# Patient Record
Sex: Male | Born: 1955 | Hispanic: No | Marital: Married
Health system: Northeastern US, Academic
[De-identification: ages and names within clinical notes are randomized; demographics above are authoritative.]

---

## 2020-04-23 IMAGING — CT CT PELVIS WITHOUT CONTRAST
3 of 5 series · 16 of 46 positions shown, 18 images · non-contrast
Comparison: There are no previous exams available for comparison.

CT PELVIS WITHOUT CONTRAST, 04/23/2020 [DATE]: 
CLINICAL INDICATION:  Epididymal cyst. Evaluate for hernia. 
A search for DICOM formatted images was conducted for prior CT imaging studies 
completed at a non-affiliated media free facility.
TECHNIQUE: The pelvis was scanned from lower abdomen though the pubic rami 
without contrast on a high-resolution CT scanner using dose reduction 
techniques.  Routine MPR reconstructions were performed.

[Series 3: st hip · axial · 0.98mm/px · z∈[-322,-7]mm · 9 of 264 slices shown, 11 images]
[im 27/264  soft-tissue]
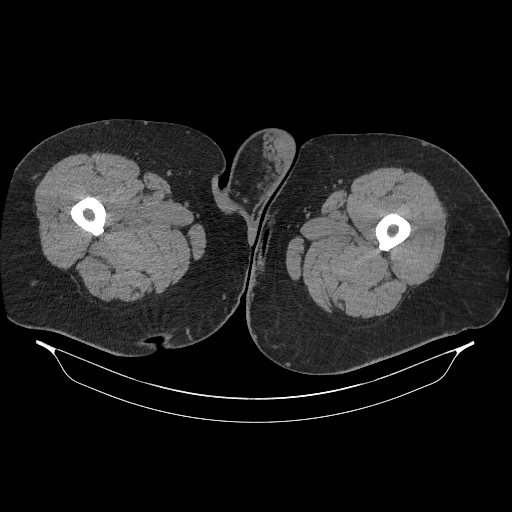
[im 27/264  bone]
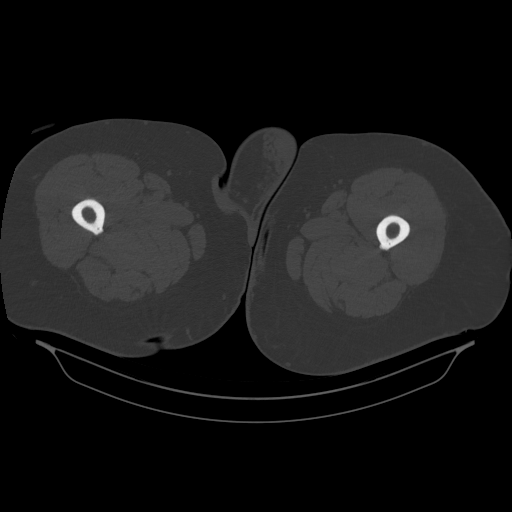
[im 53/264  soft-tissue]
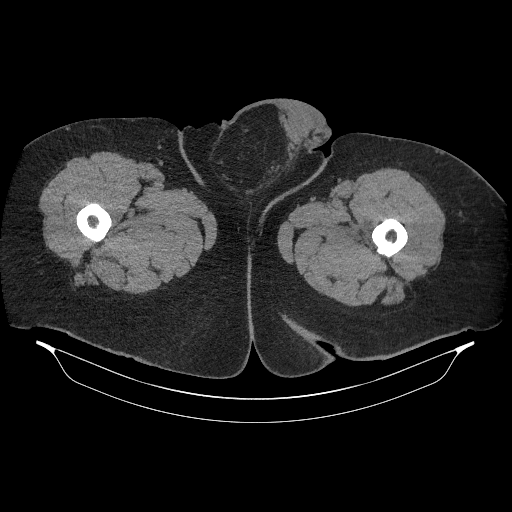
[im 79/264  soft-tissue]
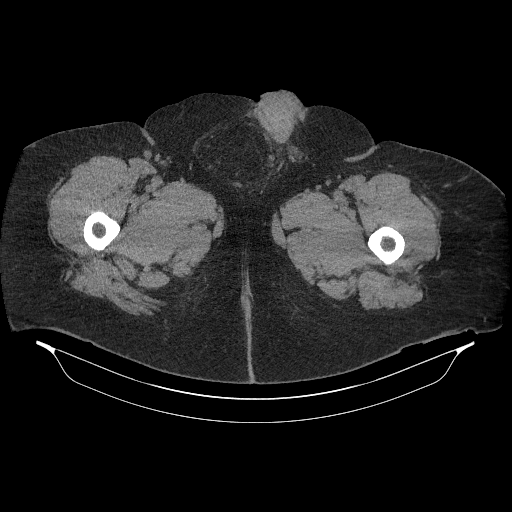
[im 106/264  soft-tissue]
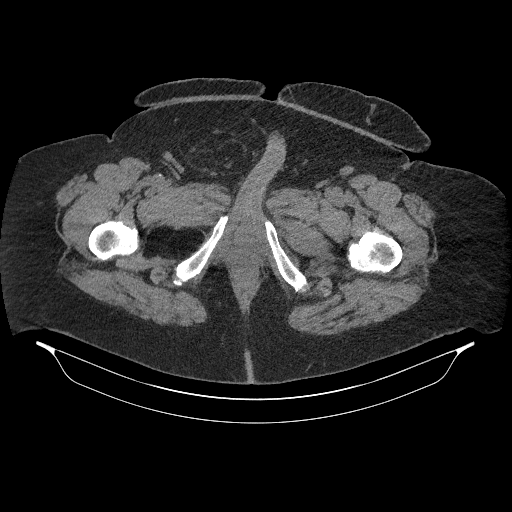
[im 132/264  soft-tissue]
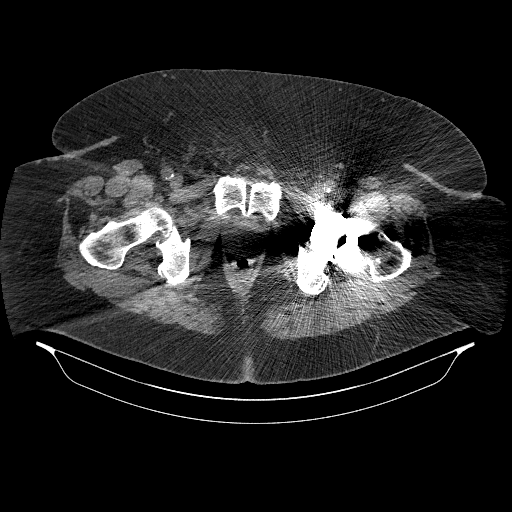
[im 158/264  soft-tissue]
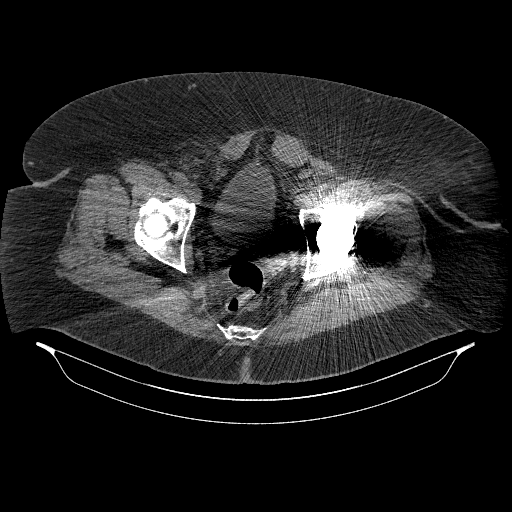
[im 185/264  soft-tissue]
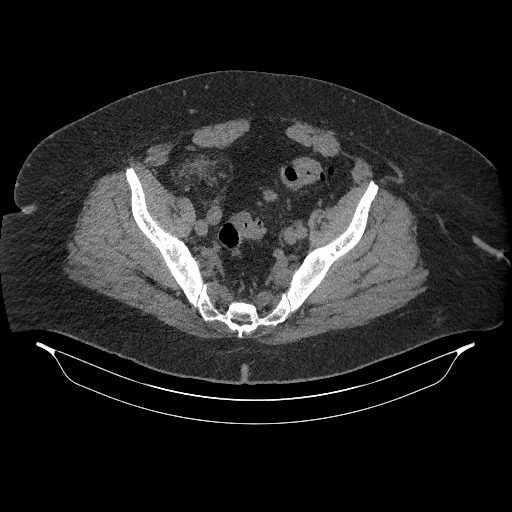
[im 211/264  soft-tissue]
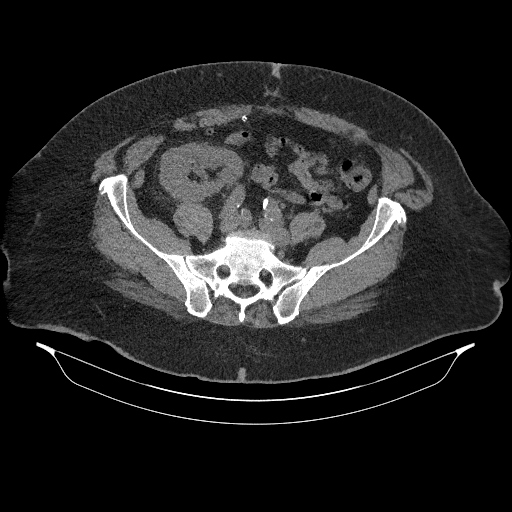
[im 237/264  soft-tissue]
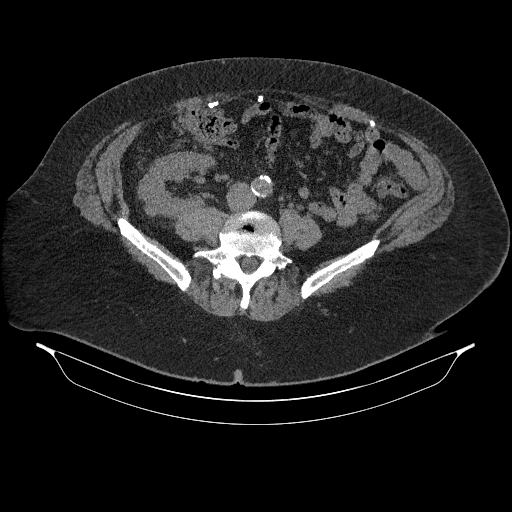
[im 237/264  bone]
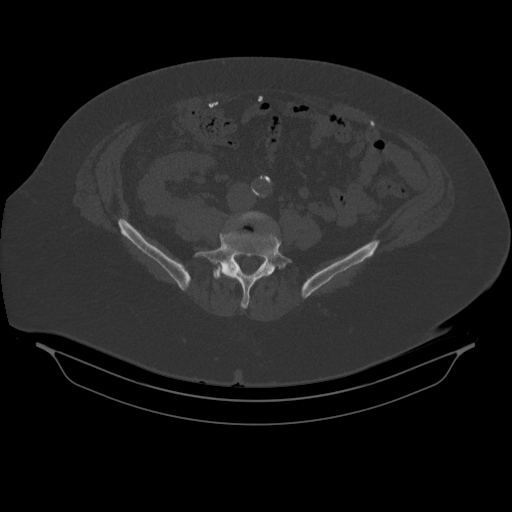

[Series 8: st hip (person_name) · axial · 0.98mm/px · z∈[-322,-204]mm · 4 of 264 slices shown]
[im 27/264  soft-tissue]
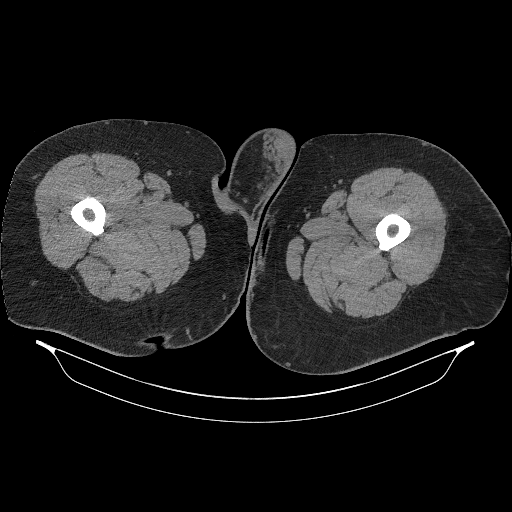
[im 53/264  soft-tissue]
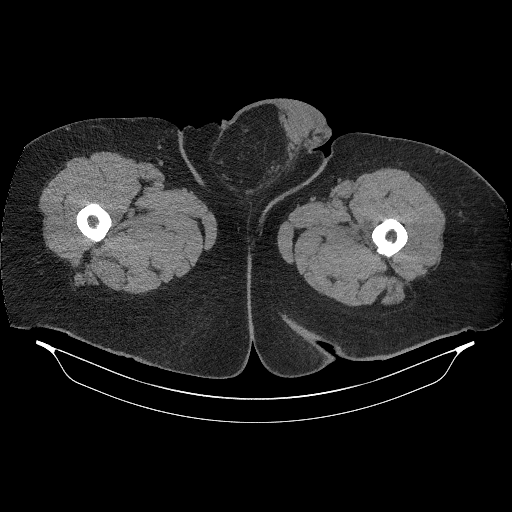
[im 79/264  soft-tissue]
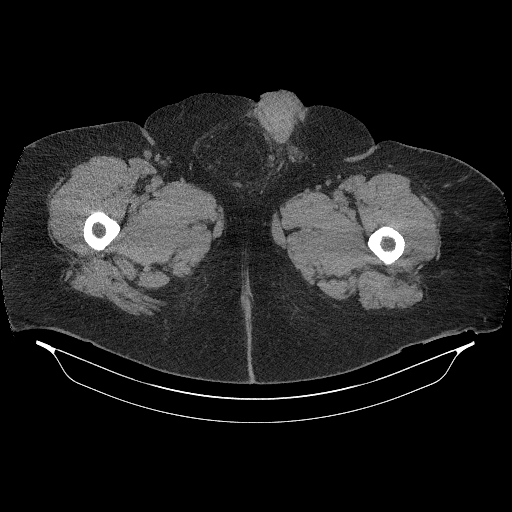
[im 106/264  soft-tissue]
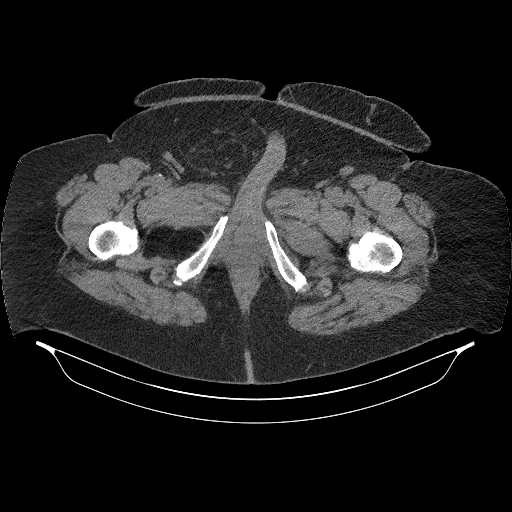

[Series 9: coronal · coronal · 0.82mm/px · 3 of 174 slices shown]
[im 58/174  soft-tissue]
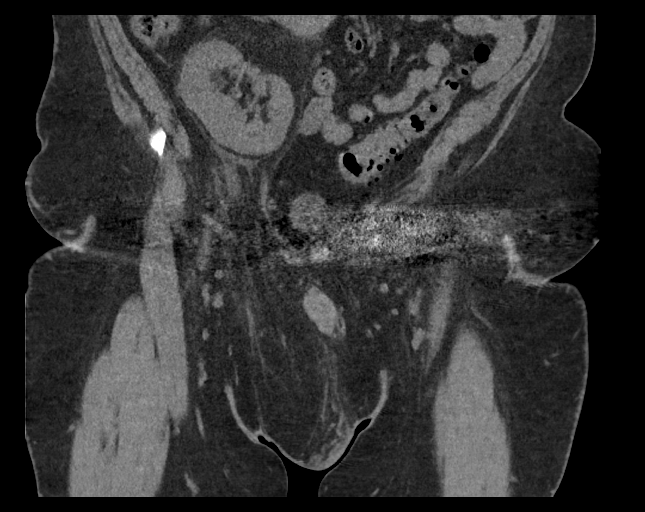
[im 77/174  soft-tissue]
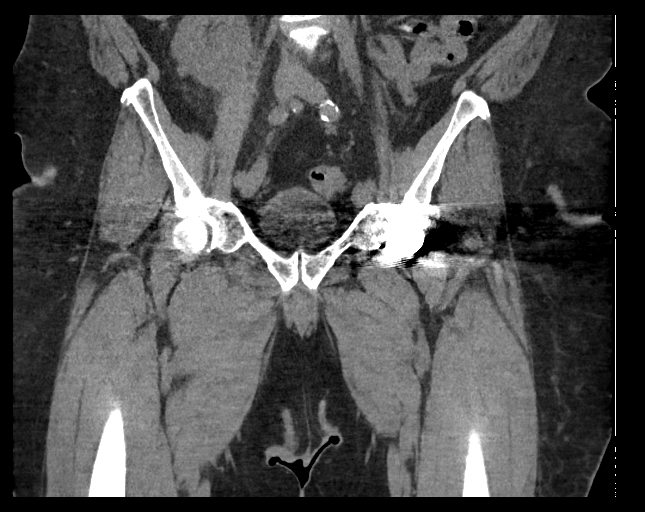
[im 97/174  soft-tissue]
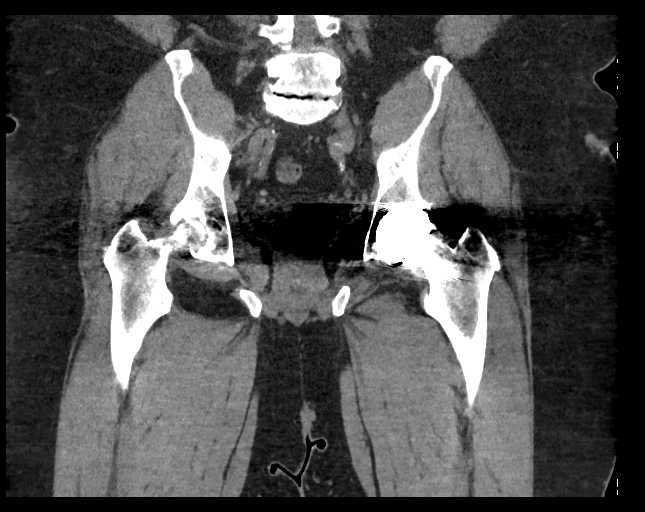

[16 of 46 positions shown; findings below may reference images not displayed]

FINDINGS: Large RIGHT inguinal hernia, 20 cm craniocaudad, 6.3 cm AP and 7.9 cm 
transverse diameter. Bilateral varicoceles. No hydrocele noted. No LEFT inguinal 
hernia. 
Right kidney is low in the RIGHT pelvis, adjacent to 
the RIGHT inguinal hernia. No hydronephrosis. Left kidney is not seen. Bladder 
is empty. Mesh over the lower anterior pelvis. Obesity. Left hip prosthesis. 
Sigmoid diverticulosis. Normal small bowel. Normal appendix. The appendix 
extends lateral to the ptotic RIGHT kidney. Calcified abdominal aorta is not 
dilated. 
Degenerative disc disease L3-4, L4-5 and L5-S1. Moderate spinal stenosis L3-4.
IMPRESSION: Large RIGHT inguinal hernia, containing fat. Ptotic RIGHT kidney adjacent to the 
upper RIGHT inguinal hernia. Bilateral varicoceles. 
RADIATION DOSE REDUCTION: All CT scans are performed using radiation dose 
reduction techniques, when applicable.  Technical factors are evaluated and 
adjusted to ensure appropriate moderation of exposure.  Automated dose 
management technology is applied to adjust the radiation doses to minimize 
exposure while achieving diagnostic quality images.

## 2021-04-14 IMAGING — MR MRI BRAIN WITHOUT CONTRAST
6 of 10 series · 19 of 48 positions shown · non-contrast
Comparison: None

MRI BRAIN WITHOUT CONTRAST, 04/14/2021 [DATE]: 
CLINICAL INDICATION: Dizziness, altered sensorium
TECHNIQUE: Axial T1, Axial T2, Axial FLAIR, Diffusion weighted images, Sagittal 
T1, and Coronal T2 MR images of the brain were performed without intravenous 
contrast enhancement.

[Series 101: survey · axial · 10.0mm · 0.94mm/px · 1 of 5 slices shown]
[im 1/5]
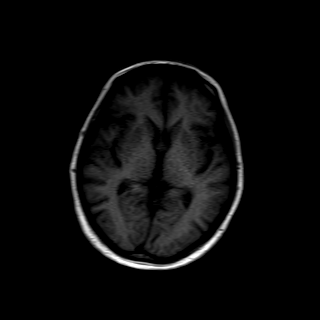

[Series 201: t1_se_sag · sagittal · 4.0mm · 0.49mm/px · 2 of 28 slices shown]
[im 1/28]
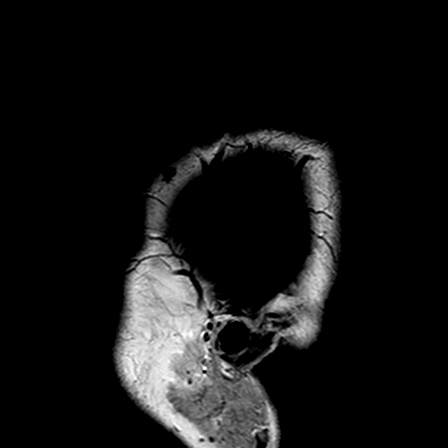
[im 28/28]
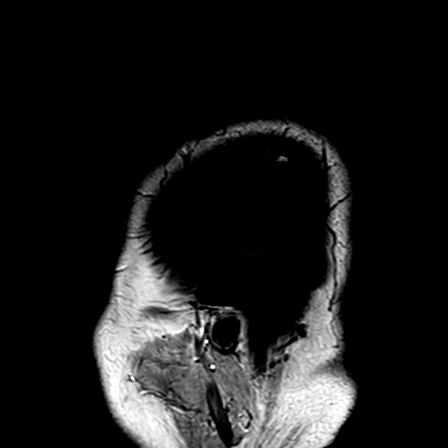

[Series 301: flair_ax · axial · 5.0mm · 0.49mm/px · z∈[-81,+74]mm · 2 of 27 slices shown]
[im 1/27]
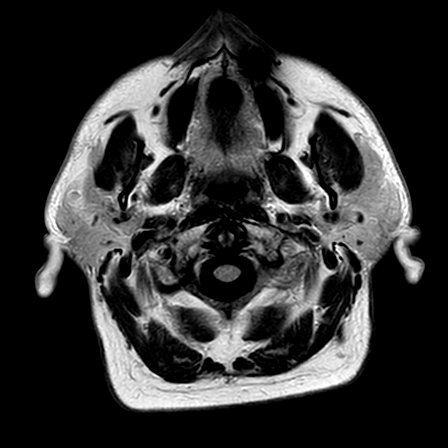
[im 27/27]
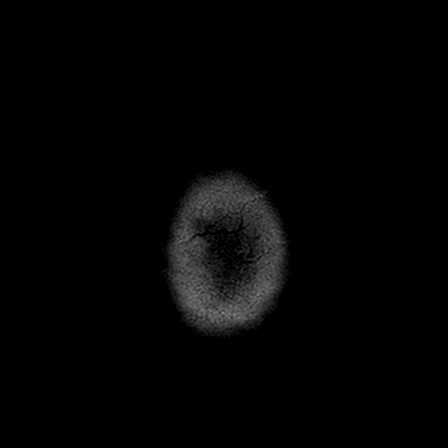

[Series 401: t1w_se_ax · axial · 5.0mm · 0.43mm/px · z∈[-81,+74]mm · 2 of 27 slices shown]
[im 1/27]
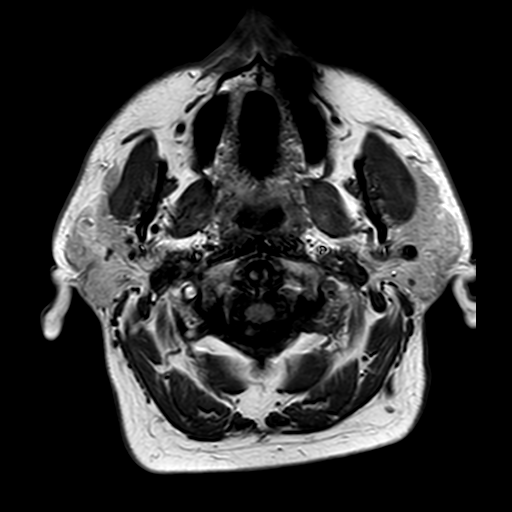
[im 27/27]
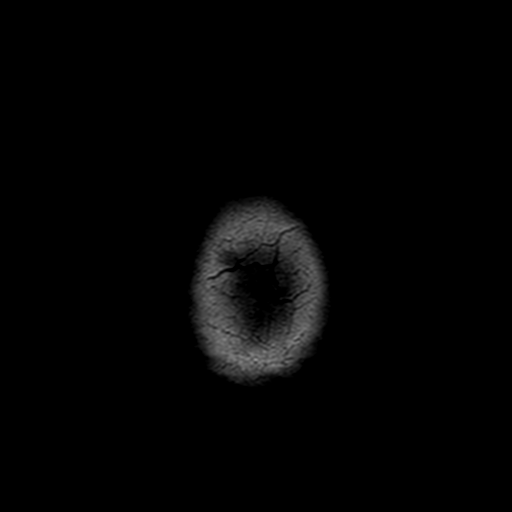

[Series 601: dwi_ax · axial · 5.0mm · 1.03mm/px · 1 of 54 slices shown]
[im 1/54]
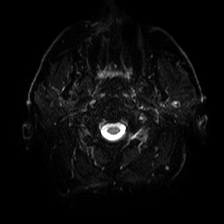

[Series 701: SWI · axial · 3.0mm · 0.40mm/px · z∈[-69,+61]mm · 11 of 200 slices shown]
[im 13/200]
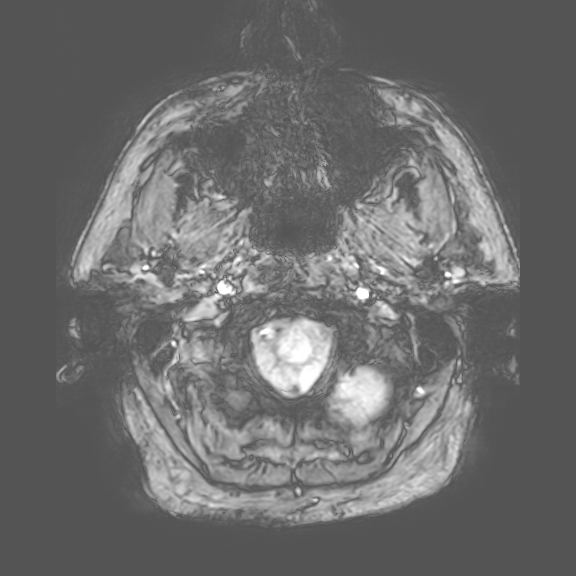
[im 25/200]
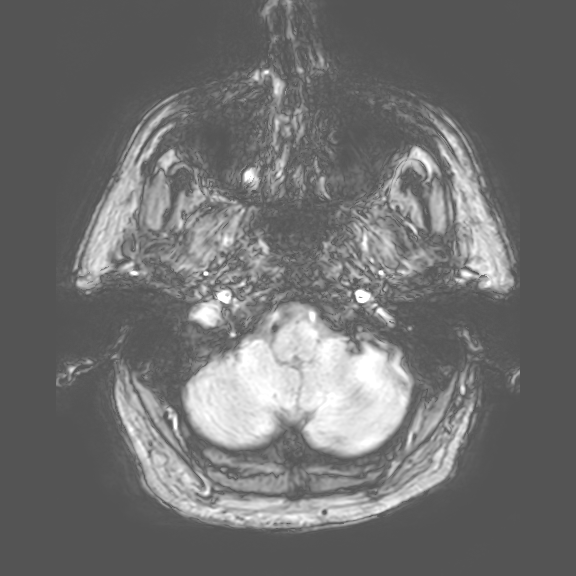
[im 38/200]
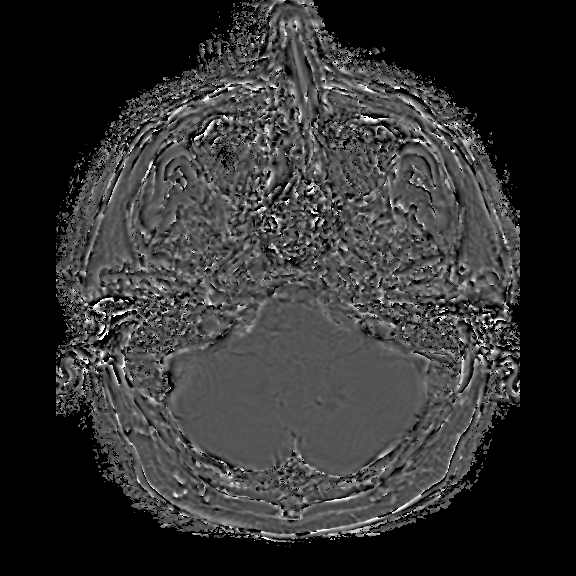
[im 63/200]
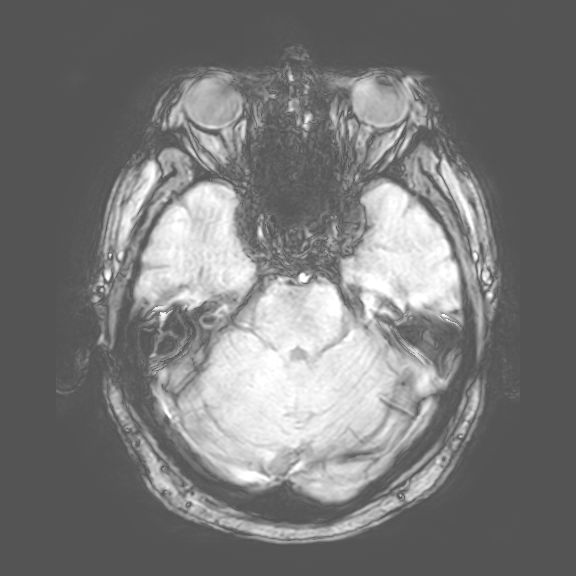
[im 88/200]
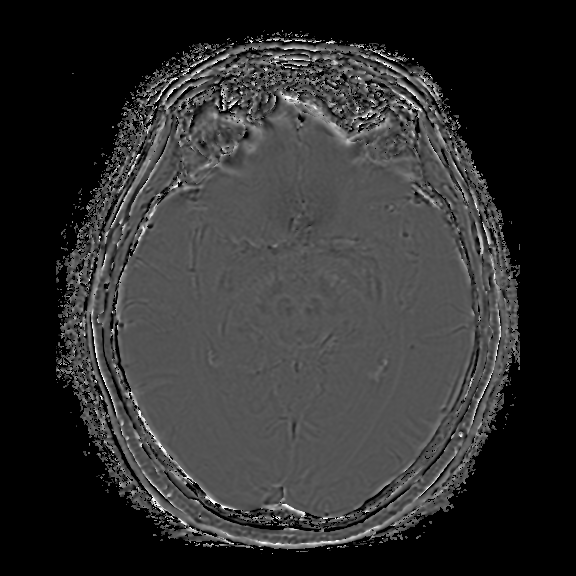
[im 100/200]
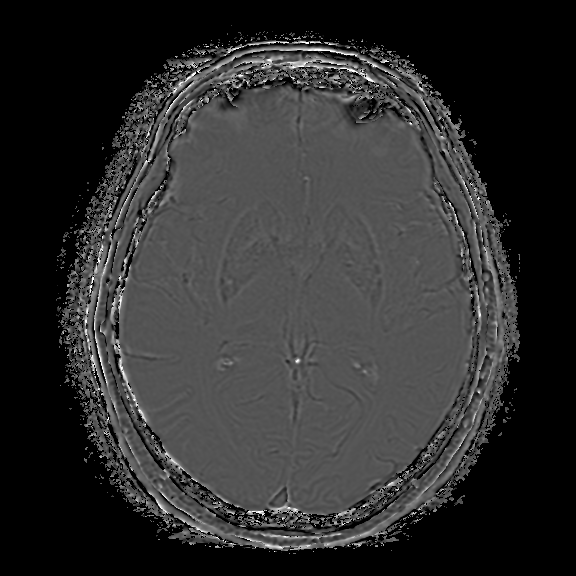
[im 112/200]
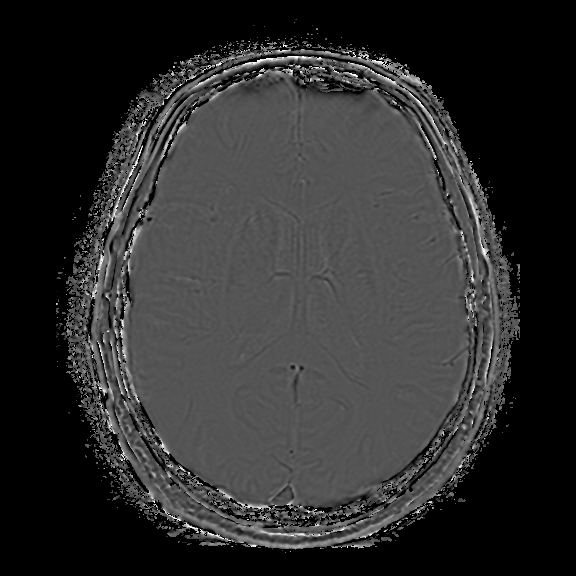
[im 137/200]
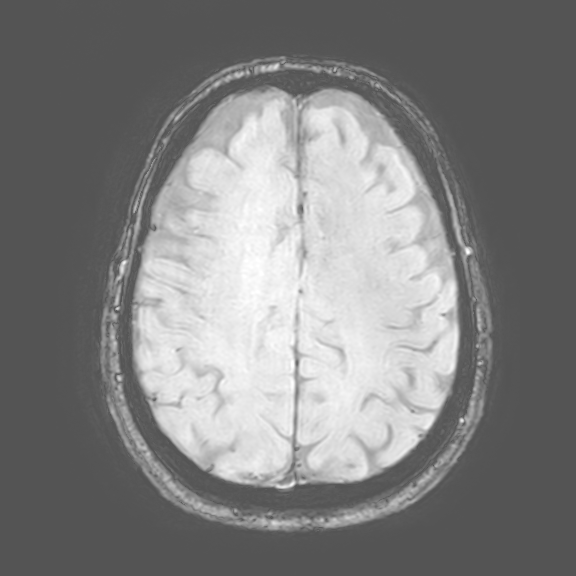
[im 162/200]
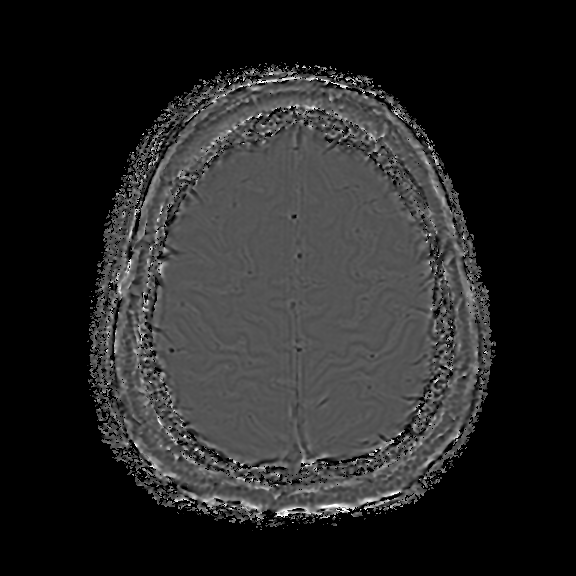
[im 175/200]
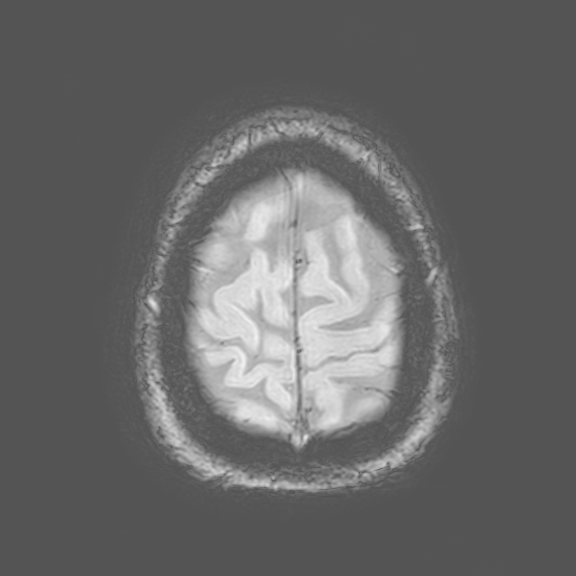
[im 187/200]
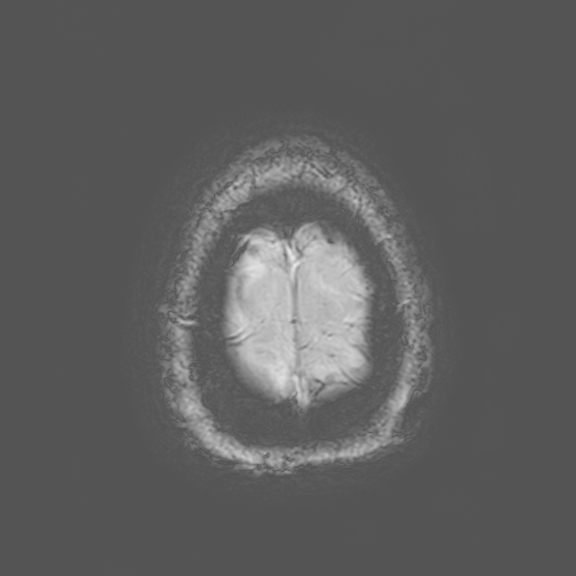

[19 of 48 positions shown; findings below may reference images not displayed]

FINDINGS: Diffusion images are negative. There is mild to moderate cortical 
atrophy along the upper frontal convexities, without significant volume loss 
elsewhere. There is good hippocampal volume preservation. There is no 
hydrocephalus. There are mild chronic appearing cerebral white matter 
microangiopathic changes. There is minimal-mild pontine small vessel change. No 
discrete cerebellar lesion. 
There is no evidence for intracranial mass. Major arterial segments appear open. 
Dural sinuses are open. 
There is mild fluid in peripheral left mastoid air cells, likely chronic. The 
tympanic cavities, mastoid antra and majority of peripheral mastoid air cells 
are well pneumatized. 
There is a small right maxillary retention cyst. Other paranasal sinuses are 
clear. There is no orbital mass. Craniocervical junction is open. Cerebellar 
tonsils extend to the foramen magnum but not below. Sellar contents are normal.
IMPRESSION: No evidence for infarct, intracranial mass or hydrocephalus. 
Mild chronic appearing white matter microangiopathic changes. Single small focus 
of hemosiderin in the left posterior temporal white matter is nonspecific. 
Amyloid angiopathy could produce similar finding; clinical correlation. 
Tympanic cavities and mastoid air cells are predominantly well pneumatized. 
There is no discrete cerebellar lesion. 
Major arterial segments are open. Right vertebral artery is dominant. Basilar 
artery is open. 
Borderline cerebellar tonsillar ectopia, within normal limits for age. 
11 mm right maxillary retention cyst. Leftward nasal septal deviation 
contributes to impingement of the nasal air passage. There is a right concha 
bullosa and turbinate mucosal hypertrophy.

## 2022-01-10 IMAGING — CT CT CHEST WITHOUT CONTRAST
2 of 4 series · 13 of 36 positions shown, 16 images · non-contrast
Comparison: Calcium scoring CT May 20, 2020

________________________________________________________________________________________________ 
CT CHEST WITHOUT CONTRAST, 01/10/2022 [DATE]: 
CLINICAL INDICATION:  Lung nodules 
A search for DICOM formatted images was conducted for prior CT imaging studies 
completed at a non-affiliated media free facility.
TECHNIQUE: The chest was scanned from base of neck through the lung bases 
without contrast on a high resolution low dose CT scanner. Routine MPR and MIP 
3D renderings were reconstructed on an independent workstation with concurrent 
physician supervision.

[Series 2: chest 2.0 i31s 3 · axial · 0.93mm/px · z∈[-358,-88]mm · 10 of 165 slices shown, 13 images]
[im 15/165  mediastinal]
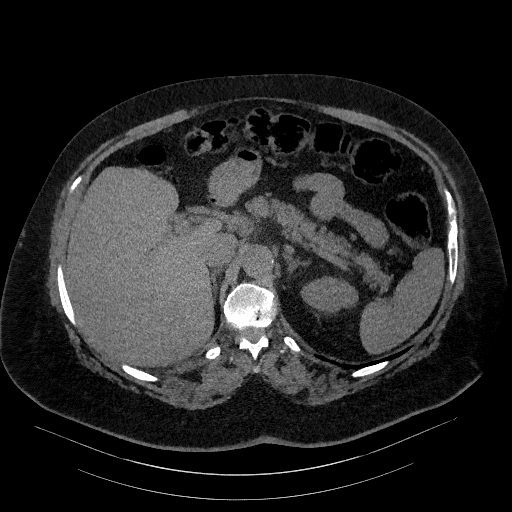
[im 15/165  lung]
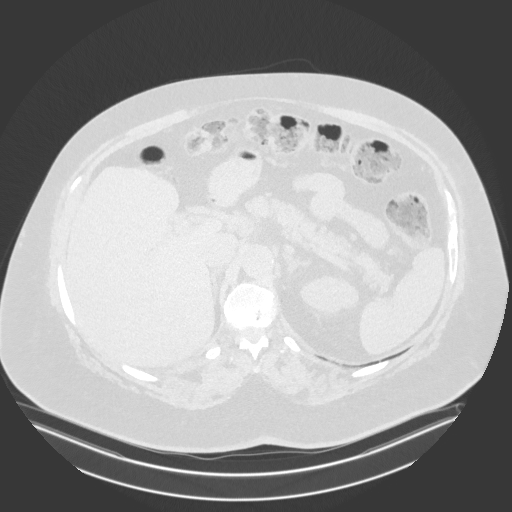
[im 30/165  lung]
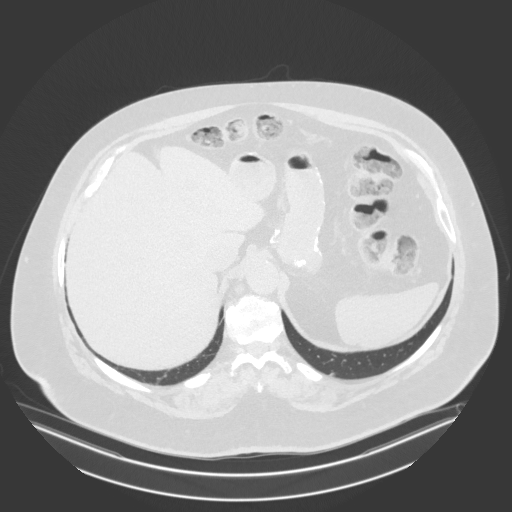
[im 45/165  lung]
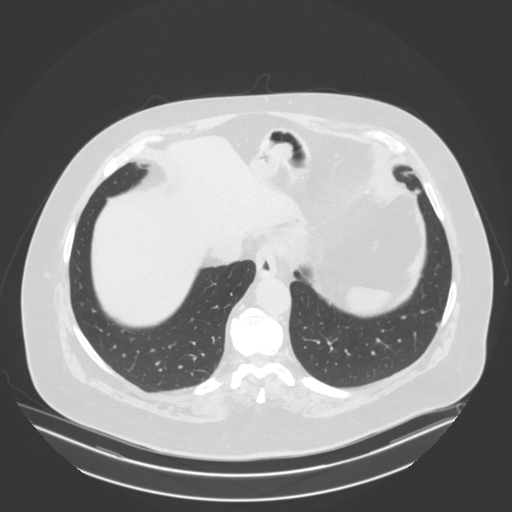
[im 60/165  lung]
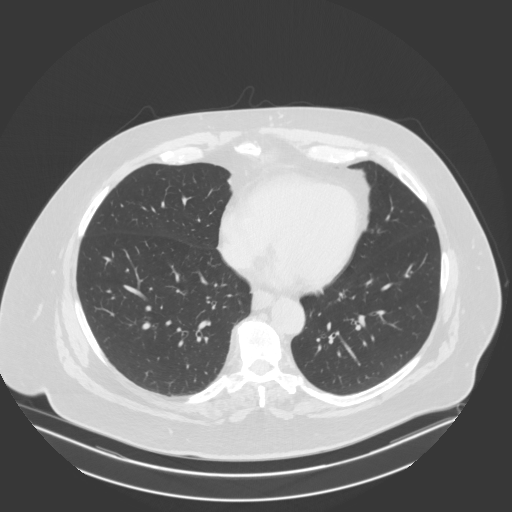
[im 75/165  mediastinal]
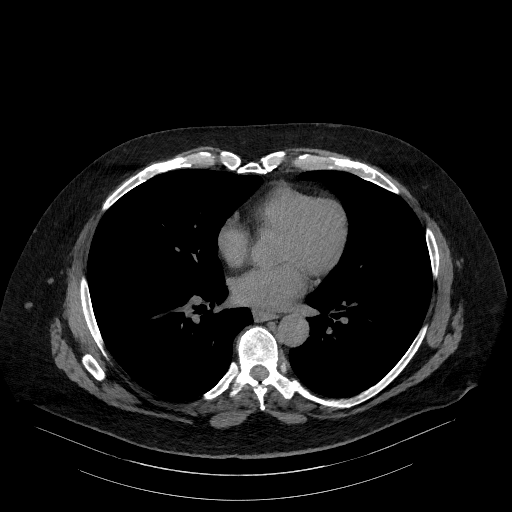
[im 75/165  lung]
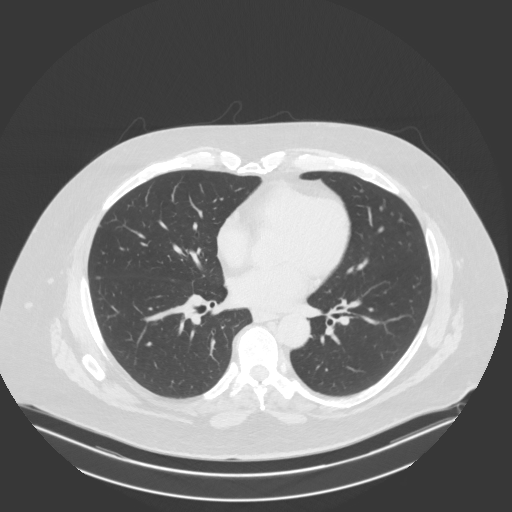
[im 90/165  lung]
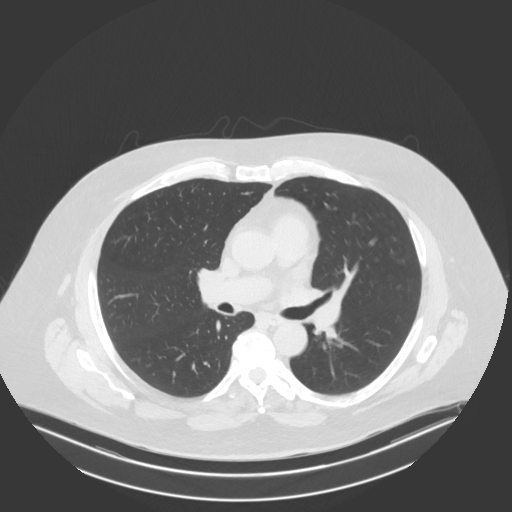
[im 105/165  lung]
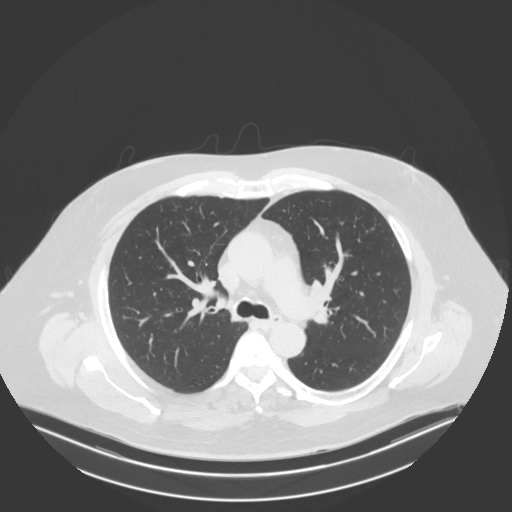
[im 120/165  lung]
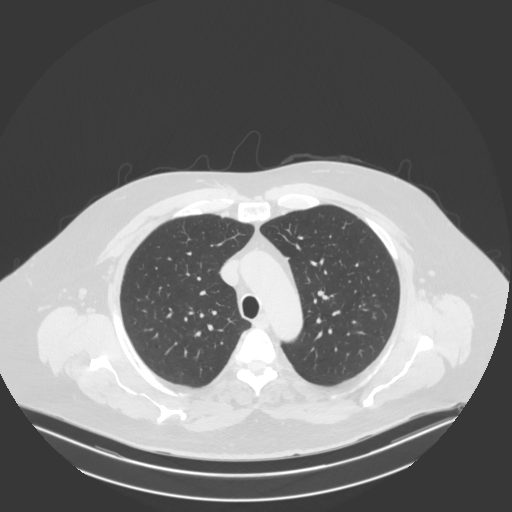
[im 135/165  mediastinal]
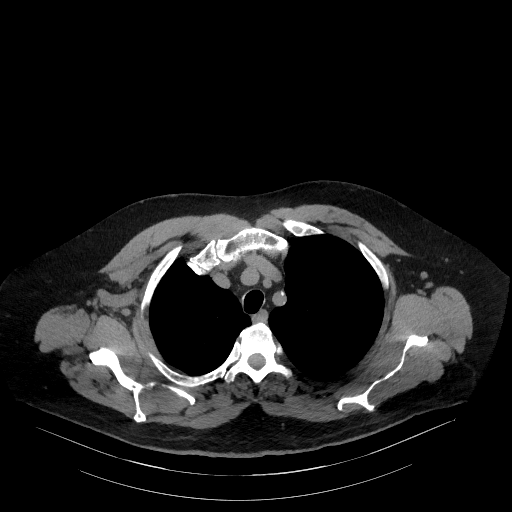
[im 135/165  lung]
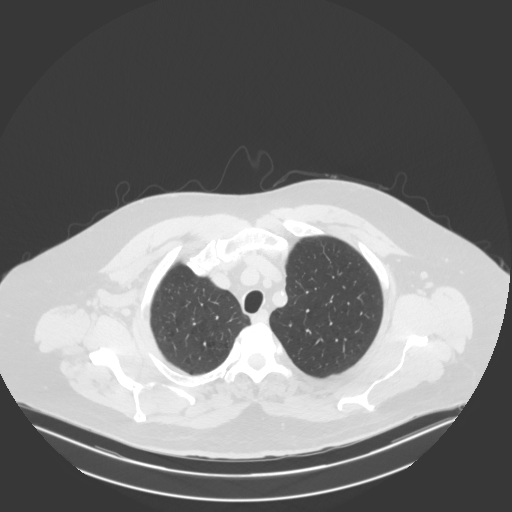
[im 150/165  lung]
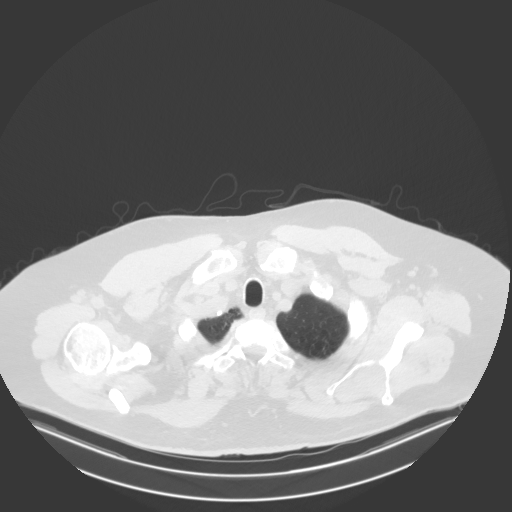

[Series 5: coronal · coronal · 0.69mm/px · 3 of 163 slices shown]
[im 33/163  lung]
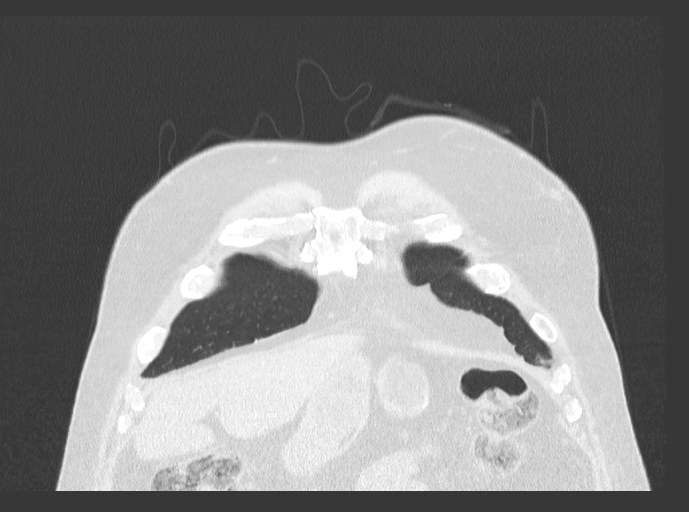
[im 65/163  lung]
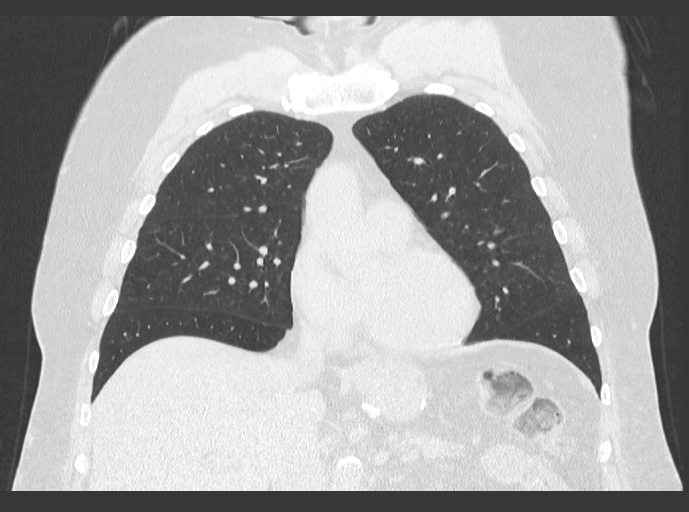
[im 98/163  lung]
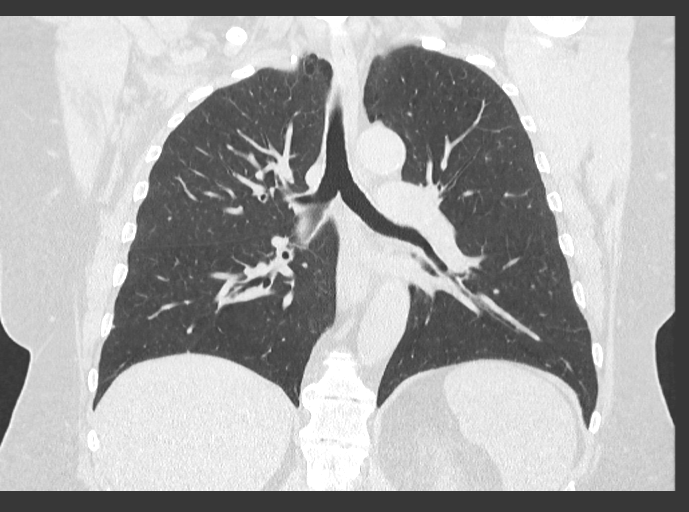

[13 of 36 positions shown; findings below may reference images not displayed]

FINDINGS: Round 5 mm nodule at the periphery of the right lower lobe seen on 
axial image 99. 3 mm right lower lobe nodule, image 92. 2.5 mm pleural-based 
nodule at the posterior sulcus of the left lower lobe, image 128, with adjacent 
1.5 mm nodule in image 126. These 2 nodules were seen on the prior calcium 
scoring examination. Image 120 shows a third 3 mm left lower lobe pleural-based 
nodule. 
There is no infiltrate or pleural effusion. There is minimal-mild bronchial wall 
thickening without bronchiectasis. Mild emphysematous changes at the lung 
apices. 
No mediastinal or axillary adenopathy. The heart is not enlarged. Mild to 
moderate coronary artery calcification. The main pulmonary artery is nondilated. 
No adrenal nodule. There has been cholecystectomy. There has been partial 
gastrectomy. Mild aortic plaque. Degenerative changes.
IMPRESSION: Micronodules as described. 2 nodules at the left lung base were seen on the 
prior calcium scoring study and are stable. Other nodules were not included in 
the imaging volume on the prior calcium scoring examination. Further follow-up 
per [facility] protocol below. 
Mild emphysematous changes. Minimal-mild bronchial wall thickening. 
Status post partial gastrectomy and cholecystectomy. 
REFERENCE: 
Recommendations for pulmonary nodule follow-up according to [HOSPITAL] 
Guidelines. 
Multiple nodules size: < 6mm 
*Low risk patients: no routine follow-up. 
*High risk patients: optional CT at 12 months. 
Multiple nodules size: 6-8mm 
*Low risk patients: follow-up at 3-6 months, then consider further follow-up at 
18-24 months. 
*High risk patients: follow-up at 3-6 months, then at 18-24 months if no change. 
Multiple nodules size: > 8mm 
*Low risk patients: follow-up at 3-6 months, then consider further follow-up at 
18-24 months. 
*High risk patients: follow-up at 3-6 months, then at 18-24 months if no change. 
NOTE: 
Low risk patients: minimal or absent history of smoking and or other known risk 
factors. 
High risk patients: history of smoking or of other known risk factors (e.g. 
first degree relative with lung cancer, or exposure to asbestos, radon uranium) 
If a nodule up to 8mm is partly solid or is ground glass further follow up is 
required after 24 months to exclude possible slow growing adenocarcinoma (PORTELA) 
Size is average of length and width. 
RADIATION DOSE REDUCTION: All CT scans are performed using radiation dose 
reduction techniques, when applicable.  Technical factors are evaluated and 
adjusted to ensure appropriate moderation of exposure.  Automated dose 
management technology is applied to adjust the radiation doses to minimize 
exposure while achieving diagnostic quality images.

## 2022-05-17 ENCOUNTER — Encounter: Admit: 2022-05-17 | Payer: PRIVATE HEALTH INSURANCE

## 2022-05-17 DIAGNOSIS — K439 Ventral hernia without obstruction or gangrene: Secondary | ICD-10-CM

## 2022-05-29 ENCOUNTER — Encounter: Admit: 2022-05-29 | Payer: PRIVATE HEALTH INSURANCE

## 2022-07-03 ENCOUNTER — Encounter: Admit: 2022-07-03 | Payer: PRIVATE HEALTH INSURANCE | Attending: Trauma Surgery

## 2022-07-03 ENCOUNTER — Ambulatory Visit: Admit: 2022-07-03 | Payer: MEDICARE | Attending: Trauma Surgery

## 2022-07-03 DIAGNOSIS — Z9989 Dependence on other enabling machines and devices: Secondary | ICD-10-CM

## 2022-07-03 DIAGNOSIS — M1990 Unspecified osteoarthritis, unspecified site: Secondary | ICD-10-CM

## 2022-07-03 DIAGNOSIS — Z9889 Other specified postprocedural states: Secondary | ICD-10-CM

## 2022-07-03 DIAGNOSIS — I499 Cardiac arrhythmia, unspecified: Secondary | ICD-10-CM

## 2022-07-03 DIAGNOSIS — G4733 Obstructive sleep apnea (adult) (pediatric): Secondary | ICD-10-CM

## 2022-07-03 DIAGNOSIS — Z972 Presence of dental prosthetic device (complete) (partial): Secondary | ICD-10-CM

## 2022-07-03 DIAGNOSIS — I4891 Unspecified atrial fibrillation: Secondary | ICD-10-CM

## 2022-07-03 DIAGNOSIS — K439 Ventral hernia without obstruction or gangrene: Secondary | ICD-10-CM

## 2022-07-03 DIAGNOSIS — Z973 Presence of spectacles and contact lenses: Secondary | ICD-10-CM

## 2022-07-07 ENCOUNTER — Encounter: Admit: 2022-07-07 | Payer: PRIVATE HEALTH INSURANCE

## 2022-07-07 ENCOUNTER — Ambulatory Visit: Admit: 2022-07-07 | Payer: MEDICARE | Attending: Family

## 2022-07-07 ENCOUNTER — Ambulatory Visit: Admit: 2022-07-07 | Payer: MEDICARE

## 2022-07-07 DIAGNOSIS — Z9989 Dependence on other enabling machines and devices: Secondary | ICD-10-CM

## 2022-07-07 DIAGNOSIS — M1990 Unspecified osteoarthritis, unspecified site: Secondary | ICD-10-CM

## 2022-07-07 DIAGNOSIS — K439 Ventral hernia without obstruction or gangrene: Secondary | ICD-10-CM

## 2022-07-07 DIAGNOSIS — I4891 Unspecified atrial fibrillation: Secondary | ICD-10-CM

## 2022-07-07 DIAGNOSIS — Z972 Presence of dental prosthetic device (complete) (partial): Secondary | ICD-10-CM

## 2022-07-07 DIAGNOSIS — G4733 Obstructive sleep apnea (adult) (pediatric): Secondary | ICD-10-CM

## 2022-07-07 DIAGNOSIS — Z973 Presence of spectacles and contact lenses: Secondary | ICD-10-CM

## 2022-07-07 DIAGNOSIS — Z9889 Other specified postprocedural states: Secondary | ICD-10-CM

## 2022-07-07 DIAGNOSIS — I499 Cardiac arrhythmia, unspecified: Secondary | ICD-10-CM

## 2022-07-07 MED ORDER — DICYCLOMINE 10 MG CAPSULE
10 mg | Status: AC
Start: 2022-07-07 — End: ?

## 2022-07-07 MED ORDER — PRAVASTATIN 40 MG TABLET
40 mg | Status: AC
Start: 2022-07-07 — End: ?

## 2022-07-07 MED ORDER — OMEPRAZOLE 40 MG CAPSULE,DELAYED RELEASE
40 mg | ORAL | Status: AC
Start: 2022-07-07 — End: ?

## 2022-07-07 MED ORDER — POLYMYXIN B SULFATE 10,000 UNIT-TRIMETHOPRIM 1 MG/ML EYE DROPS
10000 unit- 1 mg/mL | Status: AC
Start: 2022-07-07 — End: ?

## 2022-07-07 MED ORDER — AMLODIPINE 2.5 MG TABLET
2.5 mg | Status: AC
Start: 2022-07-07 — End: ?

## 2022-07-07 MED ORDER — CYANOCOBALAMIN (VIT B-12) 1,000 MCG/ML INJECTION SOLUTION
1000 mcg/mL | Status: AC
Start: 2022-07-07 — End: ?

## 2022-07-07 MED ORDER — CHOLECALCIFEROL (VITAMIN D3) 50 MCG (2,000 UNIT) TABLET
50 mcg (2,000 unit) | Freq: Every day | ORAL | Status: AC
Start: 2022-07-07 — End: ?

## 2022-07-07 NOTE — Other
AppointmentInstructions discussed with the patient? YesCopy of instructions sent home with the patient? YesAppointment Providers: NP: Norris Cross   Attending: Elisha Ponder

## 2022-07-14 NOTE — Progress Notes
Subjective:   Greg Mullen is a 66 y.o. male  who had no chief complaint listed for this encounter.HPIPatient's medications, allergies, problem list, past medical, surgical, social and family histories were reviewed and updated as appropriate. This is a 66 year old male who is well known to me.  I had seen him 7 years ago for an umbilical hernia for which he was scheduled for surgery but he subsequently moved to [place] and was lost to follow up.  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  He now returns for an epigastric lump along his upper midline incision which has been growing in size over the last few months.  He denies any nausea vomiting though does have pain with increased size of the lump with eating.  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  He is hoping to get this abdominal surgery done while he is here.  Ideally this would be done prior to his cancer or soon after.  He is planning to return to [place] in early September.Past Medical History: Diagnosis Date ? A-fib (HC Code)   s/p a-fib ablation at [facility] 2000 ? Arrhythmia   used to have A fib; occ. pac--DR. Marieb cardiologist--has not seen in many years--cardiac ablation 2000 ? Arthritis   KNEES ? CPAP (continuous positive airway pressure) dependence   instructed to bring machine and mask ? Dental bridge present   upper and lower  removable ? Morbid obesity (HC Code)  ? Obstructive sleep apnea  ? Obstructive sleep apnea (adult) (pediatric)   CPAP NIGHTLY  ? S/P ablation of atrial fibrillation 2000 ? Wears glasses  Past Surgical History: Procedure Laterality Date ? ADENOIDECTOMY   ? CARPAL TUNNEL RELEASE Bilateral  ? DENTAL SURGERY   ? HERNIA REPAIR   ? HIP SURGERY Left   resurfacing ? JOINT REPLACEMENT    partial replacement--actabular socket replacement and femoral head reinforcement ? KNEE ARTHROSCOPY Right  ? LAPAROSCOPIC CHOLECYSTECTOMY   ? LAPAROSCOPIC GASTRIC BANDING  2008 ? ROTATOR CUFF REPAIR Right  ? TONSILLECTOMY   No family history on file.Social History Tobacco Use ? Smoking status: Former   Current packs/day: 1.00   Average packs/day: 1 pack/day for 41.0 years (41.0 ttl pk-yrs)   Types: Cigarettes ? Tobacco comments:   quit in September 2015 Substance Use Topics ? Alcohol use: Yes   Comment: socially not daily ? Drug use: No No Known AllergiesNo current outpatient medications on file. No current facility-administered medications for this visit. Review of Systems Constitutional: Positive for appetite change. HENT: Negative.  Eyes: Negative.  Respiratory: Negative.  Cardiovascular: Negative.  Gastrointestinal: Positive for abdominal pain. Endocrine: Negative.  Genitourinary: Negative.  Musculoskeletal: Negative.  Skin: Negative.  Allergic/Immunologic: Positive for environmental allergies. Neurological: Negative.  Hematological: Negative.    Objective:  There were no vitals taken for this visit.There were no orthostatic vitals filed for this visit.Physical ExamConstitutional:     Appearance: Normal appearance. He is obese. HENT:    Head: Normocephalic and atraumatic.    Right Ear: External ear normal.    Left Ear: External ear normal.    Nose: Nose normal.    Mouth/Throat:    Mouth: Mucous membranes are moist.    Pharynx: Oropharynx is clear. Eyes:    Extraocular Movements:  Extraocular movements intact.    Conjunctiva/sclera: Conjunctivae normal.    Pupils: Pupils are equal, round, and reactive to light. Cardiovascular:    Rate and Rhythm: Normal rate and regular rhythm.    Pulses: Normal pulses.    Heart sounds: Normal heart sounds. No murmur heard.Pulmonary:    Effort: Pulmonary effort is normal.    Breath sounds: Normal breath sounds. No wheezing. Abdominal:    General: Bowel sounds are normal. There is no distension.    Palpations: Abdomen is soft.    Tenderness: There is abdominal tenderness. There is no guarding or rebound.    Hernia: A hernia is present. Genitourinary:   Penis: Normal.  Musculoskeletal:       General: Normal range of motion.    Cervical back: Normal range of motion and neck supple. Lymphadenopathy:    Cervical: No cervical adenopathy. Skin:   General: Skin is warm and dry. Neurological:    General: No focal deficit present.    Mental Status: He is alert and oriented to person, place, and time. Mental status is at baseline. Psychiatric:       Mood and Affect: Mood normal.       Behavior: Behavior normal.       Thought Content: Thought content normal.       Judgment: Judgment normal.   Assessment / Plan:  This is a 66 year old male who I would previously new for an umbilical hernia he now returns for an incisional hernia after his exploratory laparotomy down in [place].  He has had [place] scans done in [place] showing a ventral hernia.  We will attempt to get these records as soon as we can.  Otherwise based on the reports it appears appropriate to undergo a laparoscopic incisional hernia repair with mesh.  He would like to get this done as soon as possible.  We will attempt to get preop clearance from his primary care physician or he can be seen in the preoperative clinic here.  His performance is on August 11th so we will either try and get him scheduled as soon as possible or soon after his consult.  He is in agreement with this plan a consent was signed in the office.  We will schedule him for surgery soon as possible.Electronically Signed by Louis Meckel, MD, July 03, 2022

## 2022-07-17 ENCOUNTER — Inpatient Hospital Stay: Admit: 2022-07-17 | Discharge: 2022-07-17 | Payer: MEDICARE | Attending: Trauma Surgery

## 2022-07-17 ENCOUNTER — Encounter: Admit: 2022-07-17 | Payer: PRIVATE HEALTH INSURANCE | Attending: Trauma Surgery

## 2022-07-17 ENCOUNTER — Ambulatory Visit: Admit: 2022-07-17 | Payer: MEDICARE | Attending: Family

## 2022-07-17 DIAGNOSIS — E7800 Pure hypercholesterolemia, unspecified: Secondary | ICD-10-CM

## 2022-07-17 DIAGNOSIS — I4891 Unspecified atrial fibrillation: Secondary | ICD-10-CM

## 2022-07-17 DIAGNOSIS — Z888 Allergy status to other drugs, medicaments and biological substances status: Secondary | ICD-10-CM

## 2022-07-17 DIAGNOSIS — G4733 Obstructive sleep apnea (adult) (pediatric): Secondary | ICD-10-CM

## 2022-07-17 DIAGNOSIS — Z972 Presence of dental prosthetic device (complete) (partial): Secondary | ICD-10-CM

## 2022-07-17 DIAGNOSIS — M1990 Unspecified osteoarthritis, unspecified site: Secondary | ICD-10-CM

## 2022-07-17 DIAGNOSIS — Z87891 Personal history of nicotine dependence: Secondary | ICD-10-CM

## 2022-07-17 DIAGNOSIS — Z9889 Other specified postprocedural states: Secondary | ICD-10-CM

## 2022-07-17 DIAGNOSIS — Z973 Presence of spectacles and contact lenses: Secondary | ICD-10-CM

## 2022-07-17 DIAGNOSIS — Z79899 Other long term (current) drug therapy: Secondary | ICD-10-CM

## 2022-07-17 DIAGNOSIS — K439 Ventral hernia without obstruction or gangrene: Secondary | ICD-10-CM

## 2022-07-17 DIAGNOSIS — I499 Cardiac arrhythmia, unspecified: Secondary | ICD-10-CM

## 2022-07-17 DIAGNOSIS — Z6841 Body Mass Index (BMI) 40.0 and over, adult: Secondary | ICD-10-CM

## 2022-07-17 DIAGNOSIS — I10 Essential (primary) hypertension: Secondary | ICD-10-CM

## 2022-07-17 DIAGNOSIS — Z9989 Dependence on other enabling machines and devices: Secondary | ICD-10-CM

## 2022-07-17 MED ORDER — ACETAMINOPHEN 325 MG TABLET
325 mg | Freq: Once | ORAL | Status: CP
Start: 2022-07-17 — End: ?
  Administered 2022-07-17: 11:00:00 325 mg via ORAL

## 2022-07-17 MED ORDER — SUGAMMADEX 100 MG/ML INTRAVENOUS SOLUTION
100 mg/mL | INTRAVENOUS | Status: DC | PRN
Start: 2022-07-17 — End: 2022-07-17
  Administered 2022-07-17: 14:00:00 100 mg/mL via INTRAVENOUS

## 2022-07-17 MED ORDER — SODIUM CHLORIDE 0.9 % (FLUSH) INJECTION SYRINGE
0.9 % | Freq: Three times a day (TID) | INTRAVENOUS | Status: DC
Start: 2022-07-17 — End: 2022-07-17

## 2022-07-17 MED ORDER — HYDROMORPHONE 0.5 MG/0.5 ML INJECTION SYRINGE
0.50.5 mg/ mL | INTRAVENOUS | Status: DC | PRN
Start: 2022-07-17 — End: 2022-07-17
  Administered 2022-07-17 (×2): 0.5 mL via INTRAVENOUS

## 2022-07-17 MED ORDER — DROPERIDOL 2.5 MG/ML INJECTION SOLUTION
2.5 mg/mL | Status: CP
Start: 2022-07-17 — End: ?

## 2022-07-17 MED ORDER — LACTATED RINGERS INTRAVENOUS SOLUTION
INTRAVENOUS | Status: DC | PRN
Start: 2022-07-17 — End: 2022-07-17
  Administered 2022-07-17: 12:00:00 via INTRAVENOUS

## 2022-07-17 MED ORDER — FENTANYL (PF) 50 MCG/ML INJECTION SOLUTION
50 mcg/mL | Status: CP
Start: 2022-07-17 — End: ?

## 2022-07-17 MED ORDER — LIDOCAINE (PF) 20 MG/ML (2 %) INJECTION SOLUTION
20 mg/mL (2 %) | INTRAVENOUS | Status: DC | PRN
Start: 2022-07-17 — End: 2022-07-17
  Administered 2022-07-17: 13:00:00 20 mg/mL (2 %) via INTRAVENOUS

## 2022-07-17 MED ORDER — SODIUM CHLORIDE 0.9 % IRRIGATION SOLUTION
0.9 % irrigation | Status: CP | PRN
Start: 2022-07-17 — End: ?
  Administered 2022-07-17: 12:00:00 0.9 % irrigation

## 2022-07-17 MED ORDER — EPHEDRINE SULFATE 5 MG/ML INTRAVENOUS SOLUTION
5 mg/mL | INTRAVENOUS | Status: DC | PRN
Start: 2022-07-17 — End: 2022-07-17
  Administered 2022-07-17: 13:00:00 5 mg/mL via INTRAVENOUS

## 2022-07-17 MED ORDER — CEFAZOLIN 1 GRAM SOLUTION FOR INJECTION
1 gram | INTRAVENOUS | Status: DC | PRN
Start: 2022-07-17 — End: 2022-07-17
  Administered 2022-07-17: 13:00:00 1 gram via INTRAVENOUS

## 2022-07-17 MED ORDER — SUGAMMADEX 100 MG/ML INTRAVENOUS SOLUTION
100 mg/mL | Status: CP
Start: 2022-07-17 — End: ?

## 2022-07-17 MED ORDER — ONDANSETRON HCL (PF) 4 MG/2 ML INJECTION SOLUTION
4 mg/2 mL | INTRAVENOUS | Status: DC | PRN
Start: 2022-07-17 — End: 2022-07-17
  Administered 2022-07-17: 14:00:00 4 mg/2 mL via INTRAVENOUS

## 2022-07-17 MED ORDER — BUPIVACAINE (PF) 0.5 % (5 MG/ML) INJECTION SOLUTION
0.5 % (5 mg/mL) | Status: DC | PRN
Start: 2022-07-17 — End: 2022-07-17
  Administered 2022-07-17: 14:00:00 0.5 % (5 mg/mL)

## 2022-07-17 MED ORDER — SODIUM CHLORIDE 0.9 % (FLUSH) INJECTION SYRINGE
0.9 % | INTRAVENOUS | Status: DC | PRN
Start: 2022-07-17 — End: 2022-07-17

## 2022-07-17 MED ORDER — METOCLOPRAMIDE 5 MG/ML INJECTION SOLUTION
5 mg/mL | INTRAVENOUS | Status: DC | PRN
Start: 2022-07-17 — End: 2022-07-17
  Administered 2022-07-17: 13:00:00 5 mg/mL via INTRAVENOUS

## 2022-07-17 MED ORDER — OXYCODONE (ROXICODONE) IMMEDIATE RELEASE 2.5 MG HALFTAB
2.5 mg | ORAL | Status: DC | PRN
Start: 2022-07-17 — End: 2022-07-17

## 2022-07-17 MED ORDER — ACETAMINOPHEN 500 MG TABLET
500 mg | ORAL_TABLET | Freq: Four times a day (QID) | ORAL | 1 refills | Status: AC | PRN
Start: 2022-07-17 — End: ?

## 2022-07-17 MED ORDER — CHLORHEXIDINE GLUCONATE 2 % TOWELETTE
2 % | Freq: Once | TOPICAL | Status: DC
Start: 2022-07-17 — End: 2022-07-17

## 2022-07-17 MED ORDER — CHLORHEXIDINE GLUCONATE 0.12 % MOUTHWASH
0.12 % | Freq: Once | OROMUCOSAL | Status: CP
Start: 2022-07-17 — End: ?
  Administered 2022-07-17: 11:00:00 0.12 mL via OROMUCOSAL

## 2022-07-17 MED ORDER — OXYCODONE IMMEDIATE RELEASE 5 MG TABLET
5 mg | ORAL_TABLET | ORAL | 1 refills | Status: AC | PRN
Start: 2022-07-17 — End: ?

## 2022-07-17 MED ORDER — DROPERIDOL 2.5 MG/ML INJECTION SOLUTION
2.5 mg/mL | INTRAVENOUS | Status: DC | PRN
Start: 2022-07-17 — End: 2022-07-17

## 2022-07-17 MED ORDER — ROCURONIUM 10 MG/ML INTRAVENOUS SOLUTION
10 mg/mL | INTRAVENOUS | Status: DC | PRN
Start: 2022-07-17 — End: 2022-07-17
  Administered 2022-07-17 (×2): 10 mg/mL via INTRAVENOUS

## 2022-07-17 MED ORDER — LABETALOL 5 MG/ML INTRAVENOUS SOLUTION
5 mg/mL | INTRAVENOUS | Status: DC | PRN
Start: 2022-07-17 — End: 2022-07-17
  Administered 2022-07-17: 13:00:00 5 mg/mL via INTRAVENOUS

## 2022-07-17 MED ORDER — OXYCODONE IMMEDIATE RELEASE 5 MG TABLET
5 mg | ORAL | Status: DC | PRN
Start: 2022-07-17 — End: 2022-07-17
  Administered 2022-07-17: 15:00:00 5 mg via ORAL

## 2022-07-17 MED ORDER — HYDROMORPHONE (PF) 0.2 MG/ML INJECTION SYRINGE
0.2 mg/mL | INTRAVENOUS | Status: DC | PRN
Start: 2022-07-17 — End: 2022-07-17

## 2022-07-17 MED ORDER — FENTANYL (PF) 50 MCG/ML INJECTION SOLUTION
50 mcg/mL | INTRAVENOUS | Status: DC | PRN
Start: 2022-07-17 — End: 2022-07-17
  Administered 2022-07-17: 13:00:00 50 mcg/mL via INTRAVENOUS

## 2022-07-17 MED ORDER — PROPOFOL 10 MG/ML INTRAVENOUS EMULSION
10 mg/mL | INTRAVENOUS | Status: DC | PRN
Start: 2022-07-17 — End: 2022-07-17
  Administered 2022-07-17 (×2): 10 mg/mL via INTRAVENOUS

## 2022-07-17 NOTE — Discharge Instructions
[facility] - Outpatient Surgery Your surgical incisions are covered with surgical glue that will fall off in 10-14 days. If the surgical site becomes red of inflamed, please call our office. You may shower the day AFTER your surgery. You may take ibuprofen and extra strength tylenol for pain. For pain not controlled by tylenol and ibuprofen, a short prescription of Oxycodone is ordered. Please do not drink alcohol or drive while taking oxycodone. You may resume all normal non-strenuous activities the day AFTER your surgery.  Please do not lift more than 10lbs for the first 6wks following your operation. No diet restrictions. Please call with questions/concerns.

## 2022-07-17 NOTE — Other
Operative Diagnosis:Pre-op:   Ventral hernia without obstruction or gangrene [K43.9] Patient Coded Diagnosis   Pre-op diagnosis: Ventral hernia without obstruction or gangrene  Post-op diagnosis: Ventral hernia without obstruction or gangrene  Patient Diagnosis   Pre-op diagnosis: Ventral hernia without obstruction or gangrene [K43.9]  Post-op diagnosis:     Post-op diagnosis:   * Ventral hernia without obstruction or gangrene [K43.9]Operative Procedure(s) :Procedure(s) (LRB):Laparoscopic ventral hernia repair with mesh. (N/A)Post-op Procedure & Diagnosis ConfirmationPost-op Diagnosis: Post-op Diagnosis confirmed (no changes)Post-op Procedure: Post-op Procedure confirmed (no changes)

## 2022-07-17 NOTE — Anesthesia Post-Procedure Evaluation
Anesthesia Post-op NotePatient: Greg Mullen. McNultyProcedure(s):  Procedure(s) (LRB):Laparoscopic ventral hernia repair with mesh (N/A) Patient location: PACULast Vitals:  I have noted the vital signs as listed in the nursing notes.Mental status recovered: patient participates in evaluation: YesVital signs reviewed: YesRespiratory function stable:YesAirway is patent: YesCardiovascular function and hydration status stable: YesPain control satisfactory: YesNausea and vomiting control satisfactory:YesThere were no known notable events for this encounter.

## 2022-07-17 NOTE — Brief Op Note
[facility]: [facility]        [id number] Patient DOB: 04-Jun-1956     Surgery Date: 7/31/2023Surgeon(s) and Role:   * Joni Fears, Tilden Dome, MD - PrimaryAssistant(s):Resident: Gardenia Phlegm, MDStaff:  Circulator: Doran Clay, RNScrub Person: Harvie Heck, RNPre-Op Diagnosis: Ventral hernia without obstruction or gangrene [K43.9] Procedure(s) and Anesthesia Type:   * Laparoscopic ventral hernia repair with mesh - GENERALOperative Findings (enter relevant operative findings; do not refer to an operative report that is not yet transcribed): - Significant adhesive disease over the midline wound, with loops of small bowel- 7 x 4cm fascial defect with omentum in the upper midline, above the mesh from his prior operations.Signs of infection present at the time of surgery at the operative site: None Blood and Blood Products: none                 Drains:  noneImplants: Implant Name Type Inv. Item Serial No. Manufacturer Lot No. LRB No. Used Action MESH PARIETENE DS COMPOSITE 15CM X1 ROUND - [id number] Implant MESH PARIETENE DS COMPOSITE 15CM X1 ROUND  [employer] [id number] Midline 1 Implanted  Specimens: * No specimens in log * Clinical Staging: N/AEBL: 10cc      Post Operative Diagnosis: Ventral hernia without obstruction or gangrene [K43.9] Juanda Bond, MD7/31/202310:43 AM

## 2022-07-17 NOTE — Other
Updated patient at 0730 re: surgical delay.

## 2022-07-17 NOTE — Interval H&P Note
Subsequent to admission for surgery or invasive procedure, I have reassessed the patient by examination and review of relevant data pertaining to the planned procedure. I have verified the planned procedure and there are no relevant changes since the H&P.Feels well, ready for planned repaired on ventral hernia.Regular rateLungs clear, normal work of breathing on RAAbdomen with soft, minimally tender supraumbilical bulgeConsent in Epic tab, no marking required for midline operation.PlanProceed to OR for laparoscopic repair of ventral hernia- Juanda Bond, MDGeneral Surgery Resident

## 2022-07-17 NOTE — Other
Post Anesthesia Transfer of Care NotePatient: Greg Mullen. McNultyProcedure(s) Performed: Procedure(s) (LRB):Laparoscopic ventral hernia repair with mesh (N/A) Patient location: PACU Last Vitals: Vitals Value Taken Time BP 141/76 07/17/22 1032 Temp 97.1 07/17/22 1034 Pulse 61 07/17/22 1033 Resp 14 07/17/22 1033 SpO2 98 % 07/17/22 1033 Vitals shown include unvalidated device data.Level of consciousness: awakeTransport Vital Signs:  Stable since the last set of recorded intra-operative vital signsIntra-operative Complications: noneIntra-operative Intake & Output and Antibiotics as per Anesthesia record and discussed with the RN.

## 2022-07-17 NOTE — Anesthesia Pre-Procedure Evaluation
This is a 66 y.o. male scheduled for Laparoscopic ventral hernia repair with mesh.Review of Systems/ Medical HistoryPatient summary, nursing notes, EKG/Cardiac Studies , Labs, pre-procedure vitals, height, weight and NPO status reviewed.Anesthesia Evaluation:   No history of anesthetic complications  Estimated body mass index is 44.3 kg/m? as calculated from the following:  Height as of this encounter: 5' 9 (1.753 m).  Weight as of this encounter: 136.1 kg. CC/HPI: 66 year old male with BMI 44 and PMH of afib s/p ablation, HTN, HLD, GERD,  OA, OSA (CPAP) scheduled for Laparoscopic ventral hernia repair with mesh on 07/17/22. Past Surgical History:  Past Surgical History:No date: ADENOIDECTOMYNo date: CARPAL TUNNEL RELEASE; BilateralNo date: DENTAL SURGERYNo date: HERNIA REPAIRNo date: HIP SURGERY; Left    Comment:  resurfacingNo date: JOINT REPLACEMENT    Comment:  partial replacement--actabular socket replacement and              femoral head reinforcementNo date: KNEE ARTHROSCOPY; RightNo date: LAPAROSCOPIC CHOLECYSTECTOMY2008: LAPAROSCOPIC GASTRIC BANDINGNo date: ROTATOR CUFF REPAIR; RightNo date: TONSILLECTOMYPAH: 10/09/14: General ETT, MAC3 blade, 7.2mm ETT, VC Grade34/1/15: General ETT, Glide 4, 7.84mm ETT VC Grade 1; Oral airway for ventilation required with 2 hand FM holdCardiovascular:Patient has a history of: hypercholesterolemia and hypertension.  No angina. -Exercise tolerance: >4 METS -Coronary Artery Disease: CAD (CACS score 127 in 2021 per cardiology note) -Dysrhythmia(s): yes: atrial fibrillation (s/p ablation )-Vascular Disease:  aortic disease (4 cm per last echo 2021 per cardiology note).   No history of deep vein thrombosis. -Other Cardiovascular:  Patient denies any chest pain, palpitations, shortness of breath, dyspnea on exertion, orthopnea, paroxysmal nocturnal dyspnea or leg swelling.Follows with Dr Caryn Section in [place], [place] last OV 10/13/2021 (media)-stable no changes5/27/2021 STRESS (media)-negative for ischemia-LVH-EF 60%-no AWM. Respiratory:      -Obstructive sleep apnea:   yes, CPAP use.  -Airway Infections: The patient had a no recent URI HEENT:-Throat:  Patient has no difficulty swallowing.-Maxillofacial:  Patient does not have temporomandibular joint syndrome.Neuromuscular:  Patient has a history of no seizures.-Intracranial disorders:  He did not have a cerebrovascular accidentSkeletal/Skin:  -Joint and Skeletal Disorders: arthritis and rotator cuff tear (right s/p repair)Gastrointestinal/Genitourinary: -Gastrointestinal Disorders:  Patient has GERD. His GERD is well controlled.-Nutritional Disorders: Pt is obese (s/p gastric sleeve) per BMI definition-morbid obesity (BMI > 40).-Renal Disorders:  He does not have renal insufficiency.. -Comments: Right peritonela lipoma removed 2021Hematological/Lymphatic: NegativeEndocrine/Metabolic:  Negative.-Diabetes mellitus:  The patient does not have diabetes mellitus.-Thyroid Disorders:  Patient has no hypothyroidism.-Adrenal Disorders: Patient has a history of no history of steroid use. Behavioral/Social/Psychiatric & Syndromes: NegativePhysical ExamCardiovascular:    Rhythm: regularHeart Sounds: S1 present and S2 present.Pulmonary:  normal exam    No findings of peripheral edema on physical exam.Airway:  Exam deferredMallampati: IIITM distance: <3 FBNeck ROM: fullMouth Opening: >3cmDental:  Dentition: dentures lower and dentures upperOther Findings: Dental bridgeAnesthesia PlanASA 3 The primary anesthesia plan is  general ETT. Standard monitors. Perioperative Code Status confirmed: It is my understanding that the patient is currently designated as 'Full Code' and will remain so throughout the perioperative period.Anesthesia informed consent obtained. Consent obtained from: patientUse of blood products: consented  The post operative pain plan is IV analgesics.Opioid administration likely.Plan discussed with Attending and Resident.Anesthesiologist's Pre Op NoteI personally evaluated and examined the patient prior to the intra-operative phase of care on the day of the procedure.Marland Kitchen

## 2022-07-18 NOTE — Other
[facility]           Operative NoteCONFIDENTIAL - DO NOT COPY WITHOUT APPROPRIATE AUTHORIZATION Name: Greg Borchard McNultyMRN: [id number] CSN: [id number] Service Area: [place] of Birth: 1955-12-26 Date of Adm: 7/31/2023Date of Procedure/Surgery: 07/31/23Operation: Procedure:    Laparoscopic ventral hernia repair with meshCPT(R) Code:  78469 - PR RPR AA HERNIA 1ST < 3 CM REDUCIBLEIndication:[name] is a 65y/o male who has a symptomatic ventral hernia that has been enlarging. He reports no obstruction symptoms, and presents today for elective repair.Pre-Operative Diagnosis: Ventral hernia without obstruction or gangrene [G29.9]Post-Operative Diagnosis: Ventral hernia without obstruction or gangrene [B28.9]Surgeon: Attending: Louis Meckel, MDAssistant: Resident: Gardenia Phlegm, MDAnesthesia: ANESTHESIOLOGIST: Kirstie Mirza, MDAnesthesia Resident: Maudry Diego, MDType: General endotracheal anesthesiaEstimated Blood Loss: 15ccProcedure: After appropriate consent was obtained, the patient was brought to the operating room and placed in the supine position on the OR table.  Venodyne boots were placed and the patient was safety belted in place. Antibiotics were given for prophylaxis. An awake time out was then performed, then the patient was placed under General endotracheal anesthesia. The patient was then prepped with chlorhexidine and draped in the usual sterile fashion.Local anesthetic was injected over [facility], then a Veress entry was performed. Saline drop test was reassuring, and insufflation was attached with low flow. Pressure was 6 upon initiation, and once a pressure of 15 was reached a 5mm port was placed. Laparoscopic inspect revealed no injury from entry, but numerous midline adhesions, some containing loops of bowel. A hernia defect was partially visualized in the epigastrium, superior to previously placed intra-abdominal mesh. A 12mm port was placed in the left mid-abdomen under direct vitalization. A combination of blunt dissection and ligature was employed to resect the adhesion near the epigastric hernia defect. Another 5mm port was placed in the right lower abdomen under direct visualization. The demension of the hernia defect was measure as 7cm x 4cm.A 15cm round mesh was placed into the abdomen through the 12mm port. It was secured over the defect with two transfacial sutures, and then tacked into placed. No bleeding was noted after completion of the tacking. The 12mm port was closed with an 0 PDS via a Carter-Thompson transfacial figure-of-eight stitch. The remaining port was removed under direct visualization, and skin closed with Monocryl and surgical glue.The patient was then awakened and extubated without difficulty. The patient was then transferred from the OR table to a stretcher, and taken to the PACU for recovery in stable condition. At the end of the procedure, all needle and sponge counts were correctDr. Louis Meckel, MD was present and scrubbed for the entirety of the procedure.Attending Surgeon AttestationAgree with details of case as documented aboveI was present for the entire procedureElectronically Signed by Louis Meckel, MD

## 2022-07-31 ENCOUNTER — Telehealth: Admit: 2022-07-31 | Payer: PRIVATE HEALTH INSURANCE | Attending: Trauma Surgery

## 2022-07-31 NOTE — Telephone Encounter
Pt status post elective V.hernia repair 7/31 calls requesting a TH post op visit. [place] resident of [place] Joni Fears until [place]. Please, call patient with apt @ [phone].

## 2022-08-01 NOTE — Telephone Encounter
Patient given appt 

## 2022-08-18 ENCOUNTER — Encounter: Admit: 2022-08-18 | Payer: MEDICARE | Attending: Trauma Surgery

## 2022-08-18 ENCOUNTER — Encounter: Admit: 2022-08-18 | Payer: PRIVATE HEALTH INSURANCE | Attending: Trauma Surgery

## 2022-08-18 DIAGNOSIS — Z972 Presence of dental prosthetic device (complete) (partial): Secondary | ICD-10-CM

## 2022-08-18 DIAGNOSIS — G4733 Obstructive sleep apnea (adult) (pediatric): Secondary | ICD-10-CM

## 2022-08-18 DIAGNOSIS — Z973 Presence of spectacles and contact lenses: Secondary | ICD-10-CM

## 2022-08-18 DIAGNOSIS — Z9889 Other specified postprocedural states: Secondary | ICD-10-CM

## 2022-08-18 DIAGNOSIS — I4891 Unspecified atrial fibrillation: Secondary | ICD-10-CM

## 2022-08-18 DIAGNOSIS — M1990 Unspecified osteoarthritis, unspecified site: Secondary | ICD-10-CM

## 2022-08-18 DIAGNOSIS — K439 Ventral hernia without obstruction or gangrene: Secondary | ICD-10-CM

## 2022-08-18 DIAGNOSIS — Z9989 Dependence on other enabling machines and devices: Secondary | ICD-10-CM

## 2022-08-18 DIAGNOSIS — I499 Cardiac arrhythmia, unspecified: Secondary | ICD-10-CM

## 2022-08-26 NOTE — Progress Notes
TELEPHONE VISIT: For this visit the clinician and patient were present via telephone (audio only).Previously; Patient counseled on available options for visit type; Patient elected telephone (audio only) visit; Patient consent given for telephone (audio only) visit: YesPatient Identity was confirmed during this call.  Other individuals actively participating in the telephone encounter and their name/relation to the patient: noneTotal time spent in medical telephone (audio only) visit: 10 minutesBecause this visit was completed over telephone (audio only), a hands-on physical exam was not performed.  Patient understands and knows to call back if condition changes.---Greg Mullen is a 66 y.o. male who is s/p laparoscopic ventral hernia repair on July 31st. He was discharged home after an uneventful post-op recovery. He has no specific complaints. Tolerating regular diet and having normal bowel movements. Denies fevers or chills, nausea, vomiting.  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 male who is recovering after a laparoscopic ventral hernia repair with mesh. He should continue to obey the no-lifting restriction > 10 lbs for 8 weeks total.  Overall I am very happy with his progress and he is overall doing well.  Recommended follow up with me on an as needed basis.

## 2023-12-05 IMAGING — DX CERVICAL SPINE 4 VIEWS
4 series · 4 of 4 positions shown · non-contrast
Comparison: None

________________________________________________________________________________________________ 
CERVICAL SPINE 4 VIEWS, 12/05/2023 [DATE]: 
CLINICAL INDICATION: Cervicalgia

[AP]
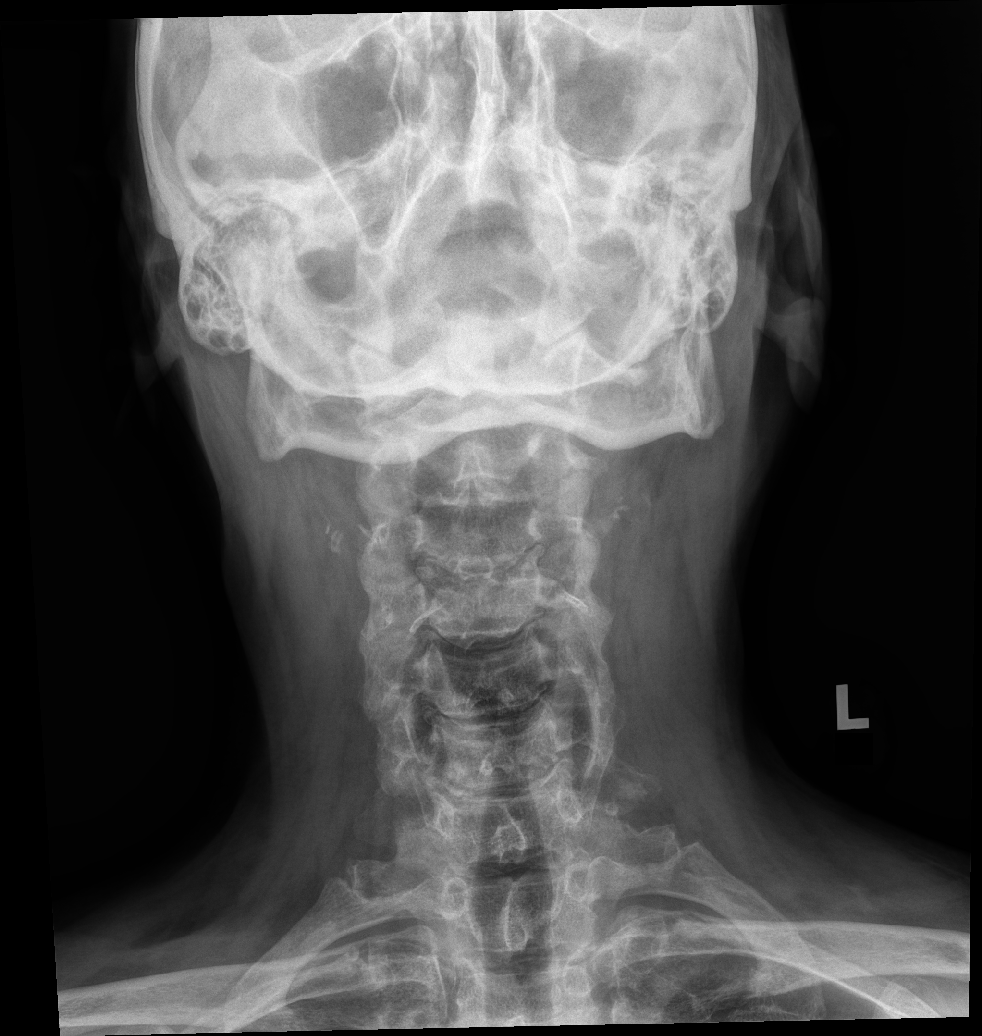

[lateral]
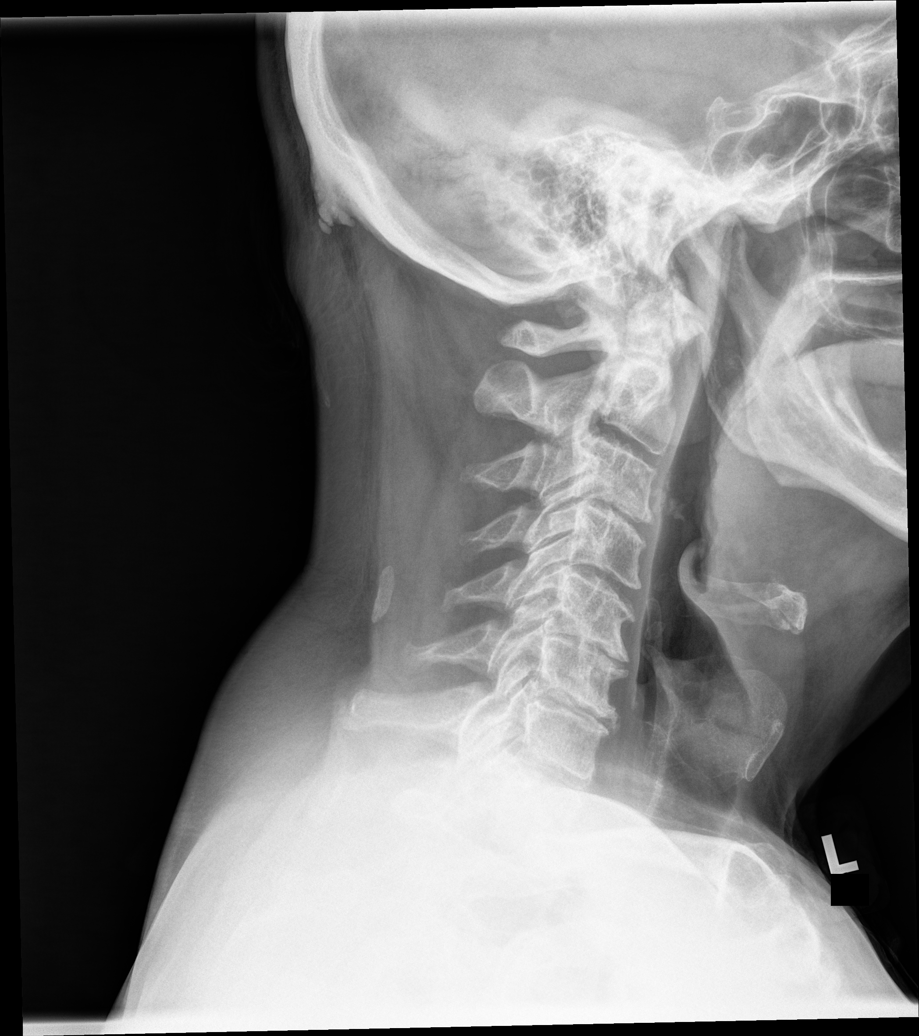

[rpo]
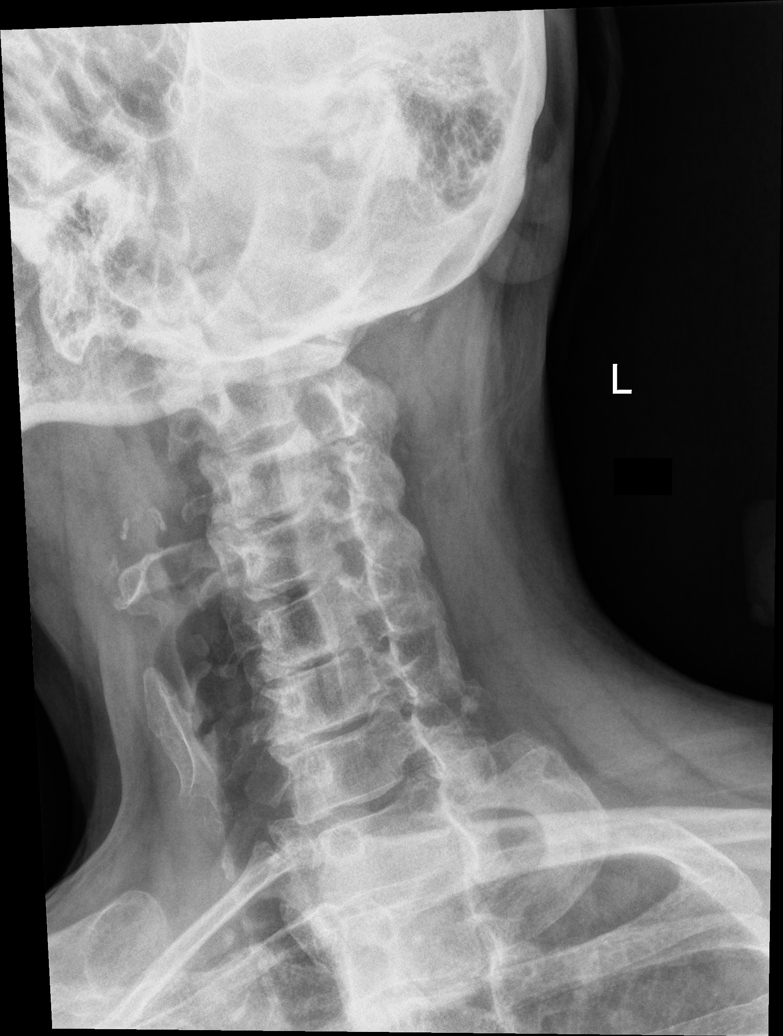

[lpo]
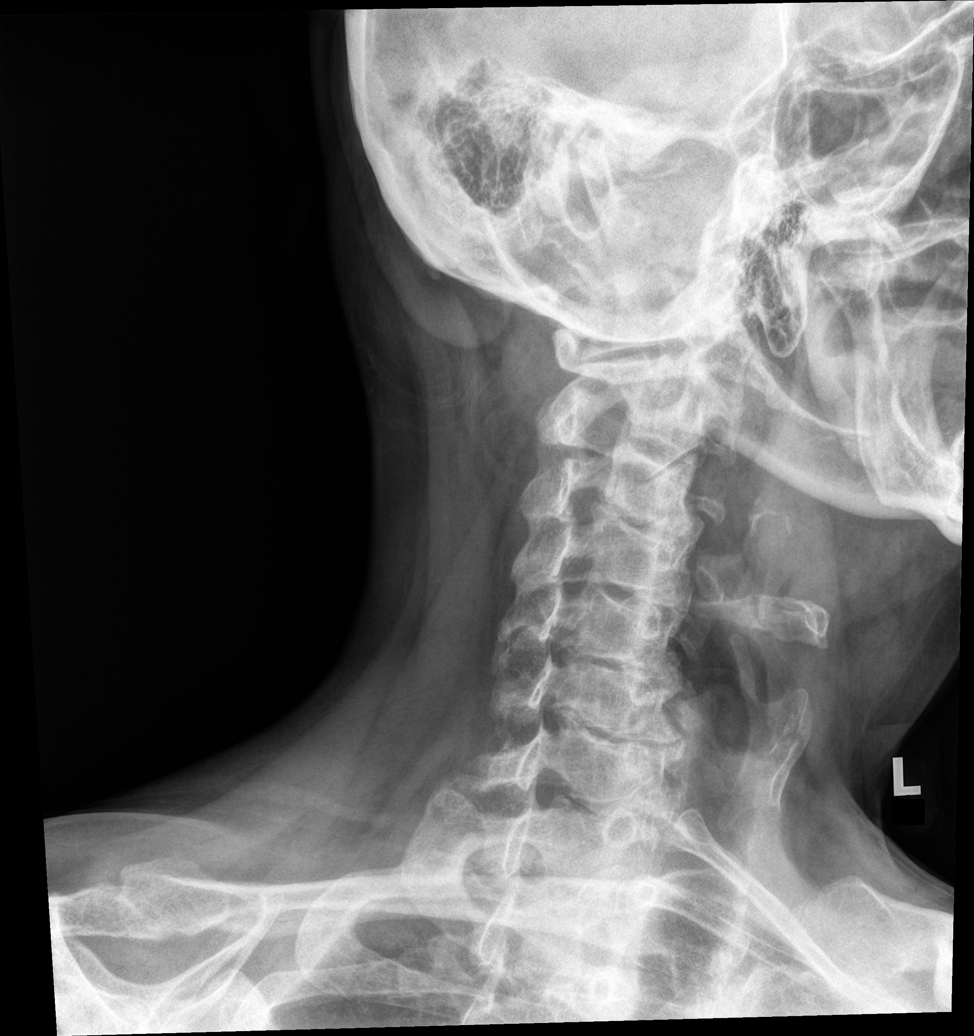

[4 of 4 positions shown; findings below may reference images not displayed]

FINDINGS: No fractures or subluxations. Marked C3-4/C5-7 disc space narrowing. 
Multifocal osteophytes. Mild uncovertebral joint hypertrophy and facet 
arthropathy. Narrowing of several bilateral neural foramina. Posterior nuchal 
calcifications. Coronary artery calcifications.
IMPRESSION: Multifocal degenerative change and bilateral neural foraminal stenosis.

## 2023-12-08 IMAGING — MR MRI CERVICAL SPINE WITHOUT CONTRAST
7 of 12 series · 11 of 48 positions shown · IV contrast (gadolinium)
Comparison: X-ray cervical spine from December 05, 2023.

________________________________________________________________________________________________ 
MRI CERVICAL SPINE WITHOUT CONTRAST, 12/08/2023 [DATE]: 
CLINICAL INDICATION: Cervicalgia . Neck pain with radiation into shoulders and 
bilateral upper extremities.
TECHNIQUE: Multiplanar, multiecho position MR images of the cervical spine were 
performed without intravenous gadolinium enhancement. Patient was scanned on a 
1.5T magnet.

[Series 901: survey · axial · 10.0mm · 1.20mm/px · z∈[-109,+76]mm · 2 of 10 slices shown]
[im 1/10]
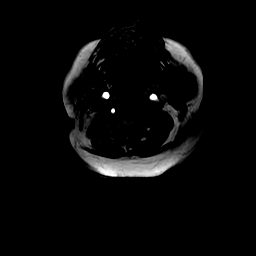
[im 10/10]
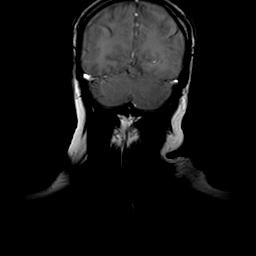

[Series 1001: t2w_cor-surv · coronal · 5.0mm · 0.69mm/px · 1 of 7 slices shown]
[im 1/7]
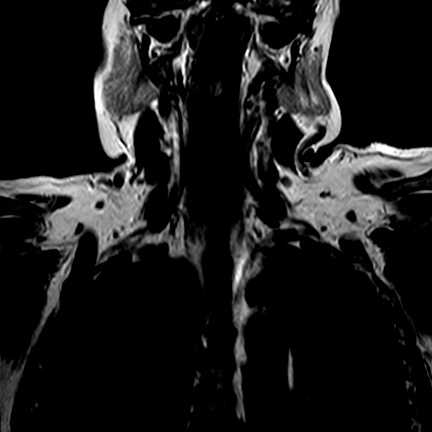

[Series 1101: T1 · sagittal · 3.0mm · 0.39mm/px · 1 of 15 slices shown]
[im 1/15]
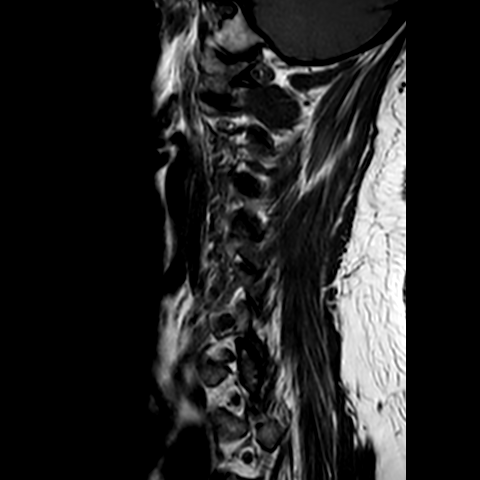

[Series 1202: (id)_mdixon_tse · sagittal · 3.0mm · 0.35mm/px · 1 of 15 slices shown]
[im 1/15]
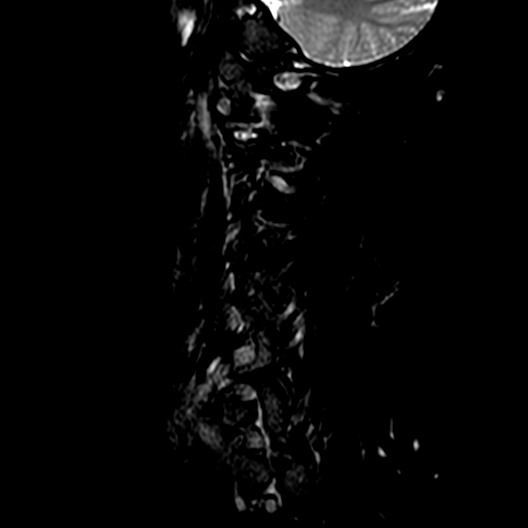

[Series 1203: st2w_mdixon_tse · sagittal · 3.0mm · 0.35mm/px · 1 of 15 slices shown]
[im 1/15]
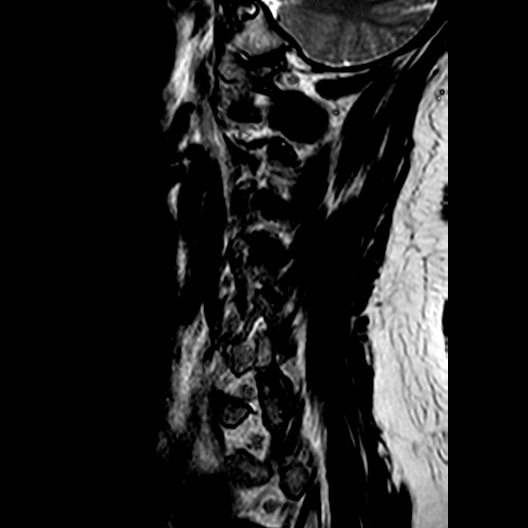

[Series 1304: csp oblq left · oblique · 1.0mm · 0.15mm/px · 3 of 36 slices shown]
[im 1/36]
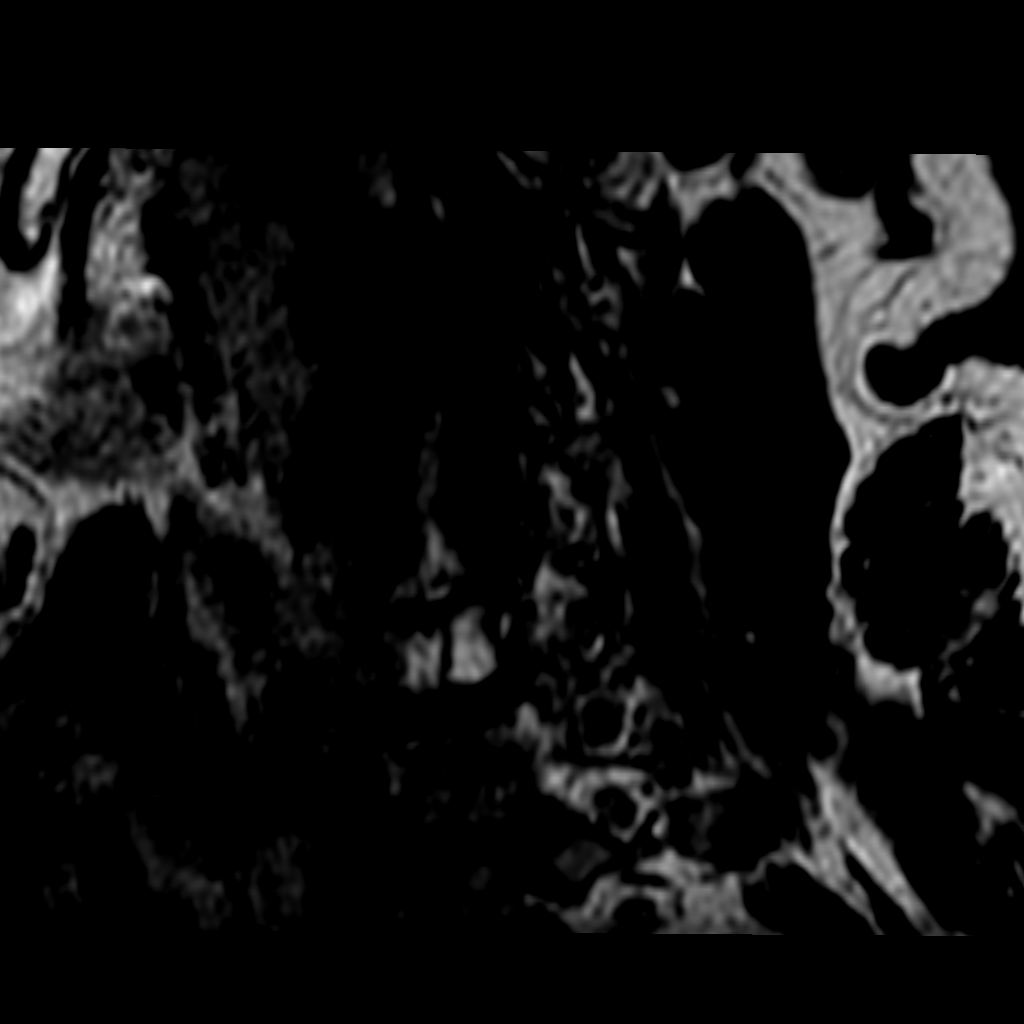
[im 18/36]
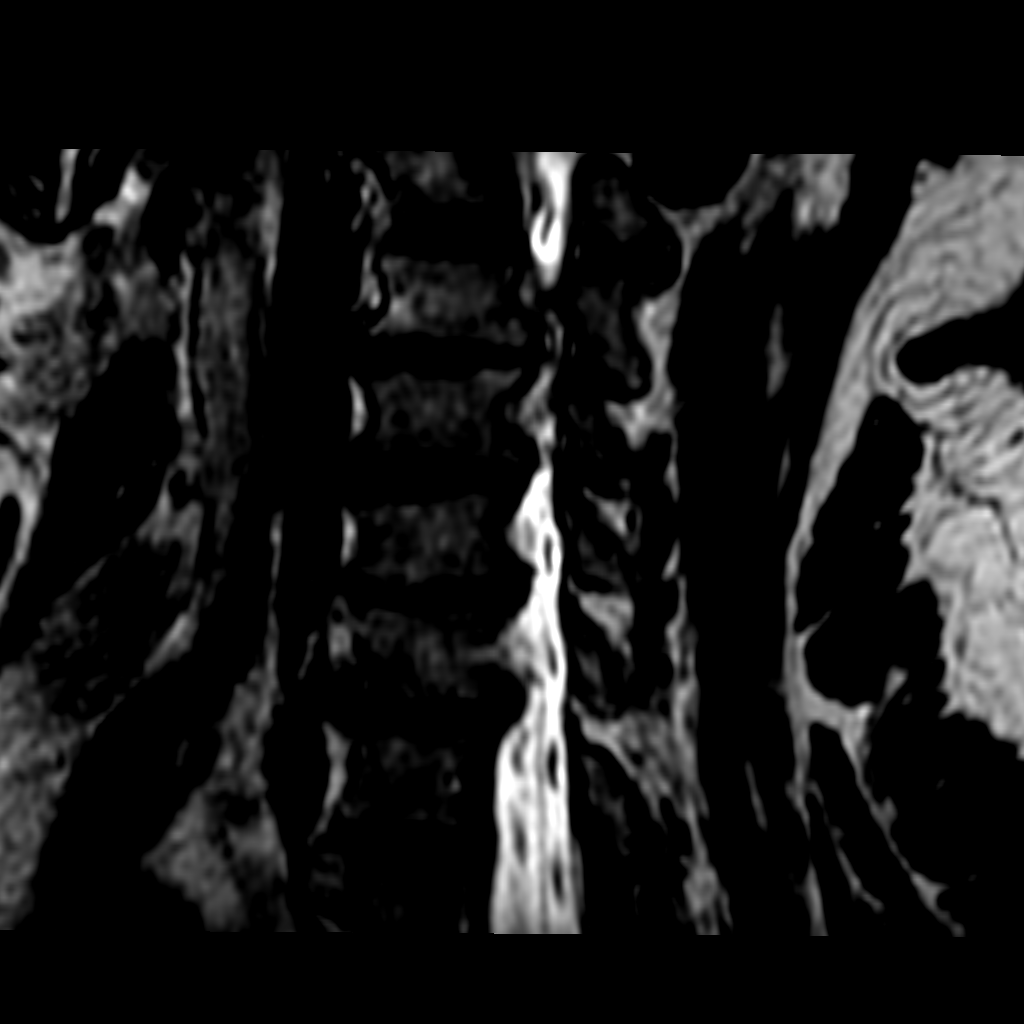
[im 36/36]
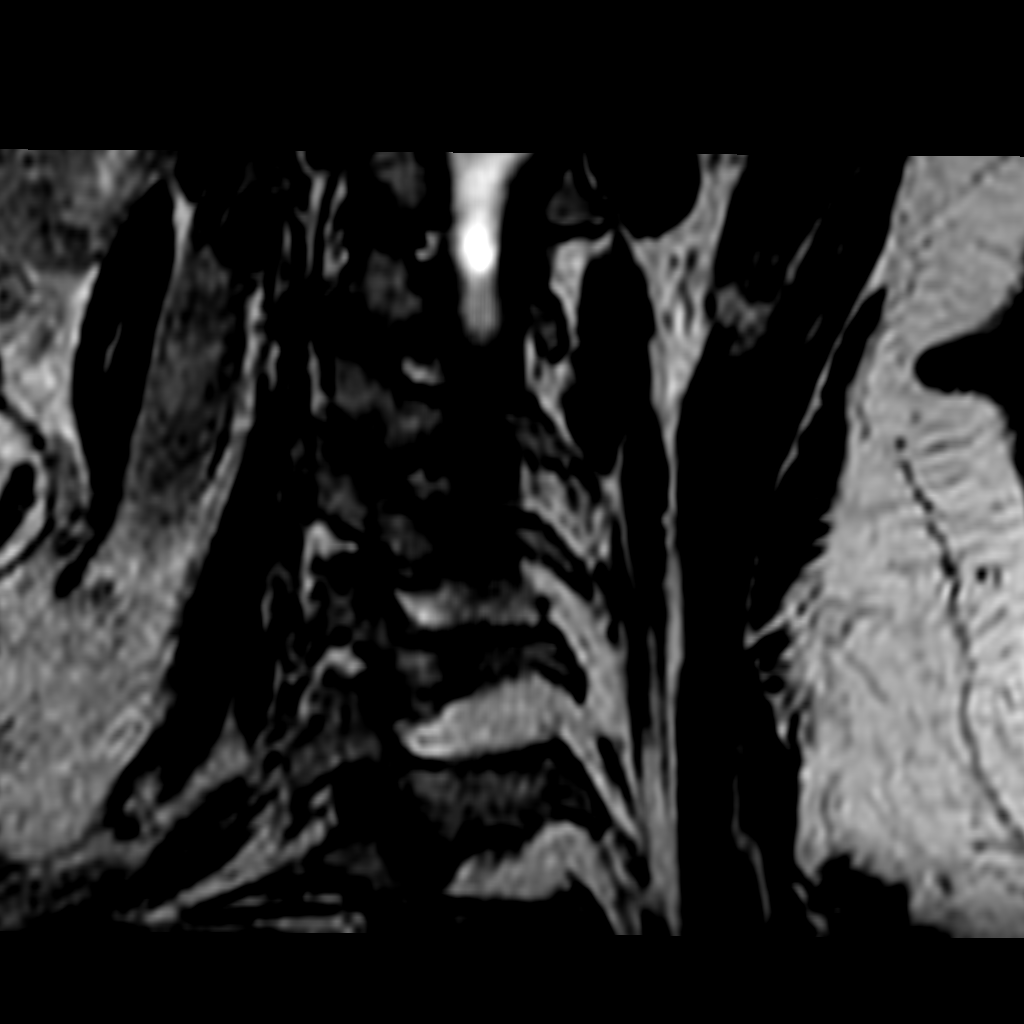

[Series 1401: T2 · axial · 3.0mm · 0.29mm/px · z∈[-203,-124]mm · 2 of 18 slices shown]
[im 1/18]
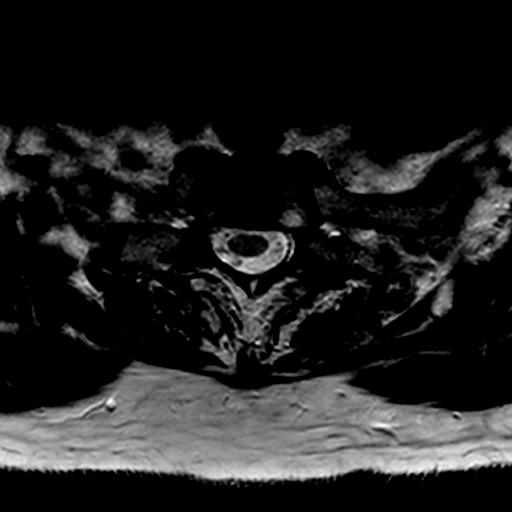
[im 18/18]
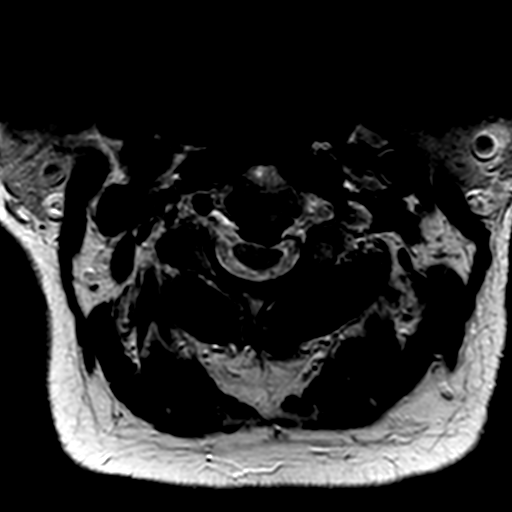

[11 of 48 positions shown; findings below may reference images not displayed]

FINDINGS: -------------------------------------------------------------------------------- 
----------------- 
GENERAL: 
ALIGNMENT: Mild anterolisthesis of C2 relative to C3. Mild retrolisthesis of C3 
on C4. Straightening of normal cervical lordosis. Rightward curvature of 
cervical spine. 
VERTEBRAL BODY HEIGHT: Normal.  
MARROW SIGNAL: No focal suspect signal abnormality. 
CORD SIGNAL: Normal.  
ADDITIONAL FINDINGS: None. 
-------------------------------------------------------------------------------- 
---------------- 
SEGMENTAL: 
CRANIOCERVICAL JUNCTION: No significant stenosis. 
C2-C3: Central to left central disc herniation with deformity of ventral cord 
and moderate central canal narrowing.  No significant right neural foraminal 
narrowing. No significant left neural foraminal narrowing.  
C3-C4: Loss of disc height with disc osteophyte complex. Right central disc 
herniation. Bilateral uncovertebral joint hypertrophy. Left facet hypertrophy. 
Mild central canal narrowing with deformity of the ventral cord focally by the 
disc herniation.  Mild right neural foraminal narrowing. Severe left neural 
foraminal narrowing.  
C4-C5: Disc osteophyte complex eccentric to the left. Bilateral uncovertebral 
joint hypertrophy. Deformity of the left ventral cord with mild central canal 
narrowing. No significant right neural foraminal narrowing. Severe left neural 
foraminal narrowing.  
C5-C6: Disc osteophyte complex with bilateral uncovertebral joint hypertrophy. 
Deformity of ventral cord with mild central canal narrowing.  Moderate right 
neural foraminal narrowing. Mild left neural foraminal narrowing.  
C6-C7: Disc osteophyte complex with bilateral uncovertebral joint hypertrophy. 
Deformity of the ventral cord. Mild central canal narrowing.  Mild right neural 
foraminal narrowing. Moderate left neural foraminal narrowing.  
C7-T1: Mild disc osteophyte complex. No significant central canal narrowing.  No 
significant right neural foraminal narrowing. No significant left neural 
foraminal narrowing.  
-------------------------------------------------------------------------------- 
---------------
IMPRESSION: 1.  Discogenic/degenerative changes as above. 
2.  Cord signal abnormality: None. 
3.  Cord deformity: C2-C3, C3-C4, C4-C5, C5-C6, C6-C7 
4.  Moderate central canal narrowing at C2-C3. 
5.  Severe neural foraminal narrowing: C3-C4 (left), C4-C5 (left).

## 2023-12-08 IMAGING — MR MRI BRAIN WITHOUT CONTRAST
8 of 11 series · 25 of 48 positions shown · IV contrast (gadolinium)
Comparison: MRI brain from April 14, 2021.

________________________________________________________________________________________________ 
MRI BRAIN WITHOUT CONTRAST, 12/08/2023 [DATE]: 
CLINICAL INDICATION: Headache, Unspecified . Posterior throbbing head pain which 
comes and goes.
TECHNIQUE: Multiplanar, multiecho position MR images of the brain were performed 
without intravenous gadolinium enhancement. Patient was scanned on a
magnet.

[Series 102: mpr - smartbrain · axial · 1.1mm · 1.09mm/px · 1 of 2 slices shown]
[im 1/2]
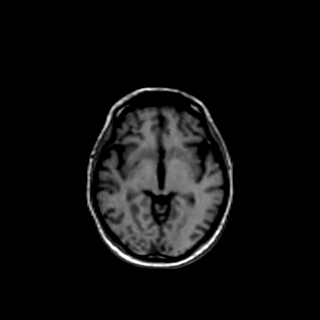

[Series 203: dadc map · axial · 5.0mm · 1.03mm/px · z∈[-82,+73]mm · 2 of 27 slices shown (1 of 2)]
[im 1/27]
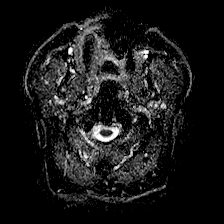
[im 27/27]
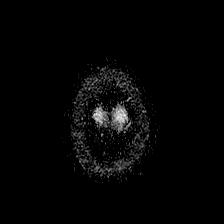

[Series 204: (id) · axial · 5.0mm · 1.03mm/px · z∈[-82,+73]mm · 2 of 27 slices shown (1 of 2)]
[im 1/27]
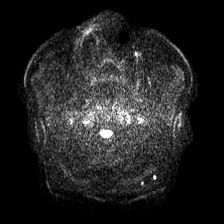
[im 27/27]
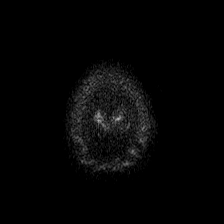

[Series 303: dadc map · coronal · 5.0mm · 0.81mm/px · 3 of 33 slices shown (2 of 2)]
[im 1/33]
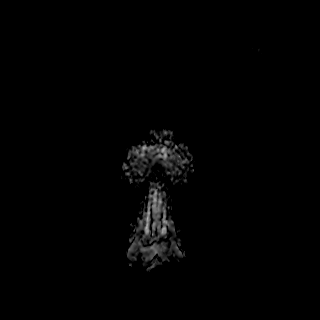
[im 17/33]
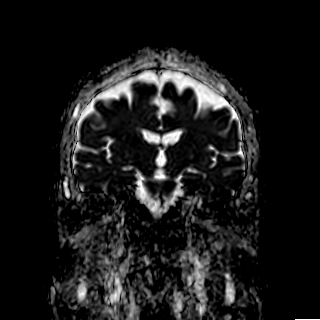
[im 33/33]
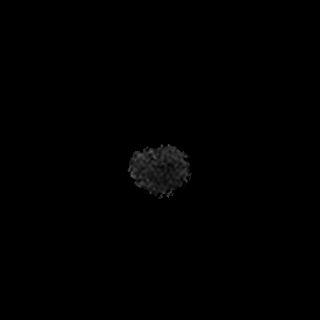

[Series 304: (id) · coronal · 5.0mm · 0.81mm/px · 3 of 34 slices shown (2 of 2)]
[im 1/34]
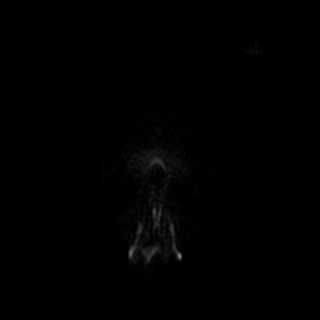
[im 17/34]
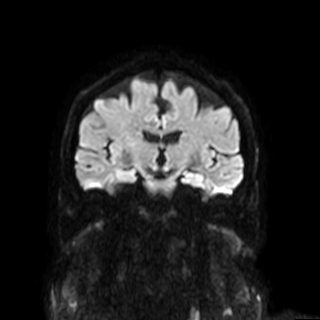
[im 34/34]
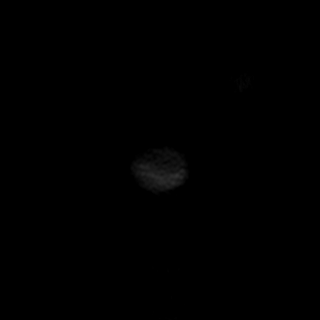

[Series 401: t1_se_sag · sagittal · 4.0mm · 0.43mm/px · 3 of 28 slices shown]
[im 1/28]
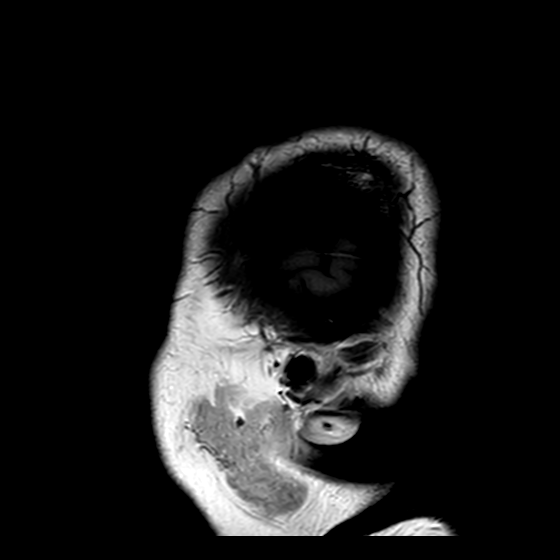
[im 14/28]
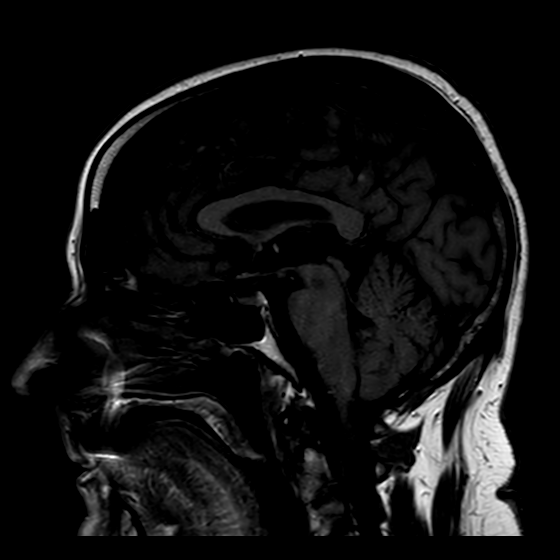
[im 28/28]
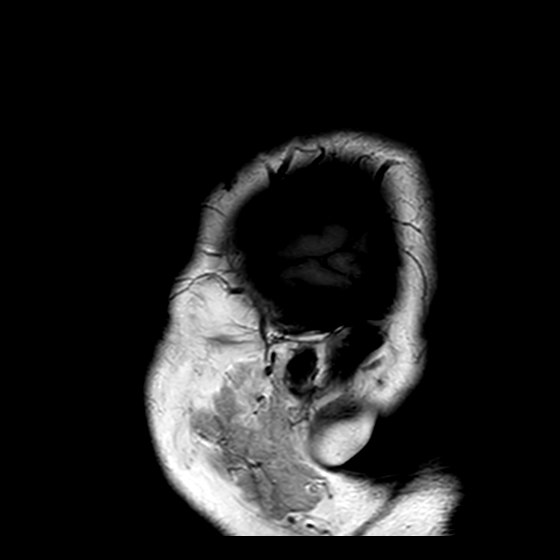

[Series 501: flair_ax+fs · axial · 5.0mm · 0.49mm/px · z∈[-82,+73]mm · 3 of 27 slices shown]
[im 1/27]
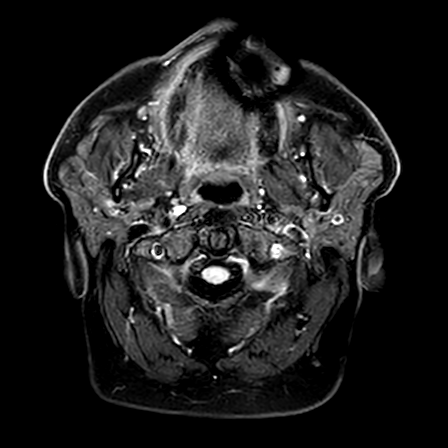
[im 14/27]
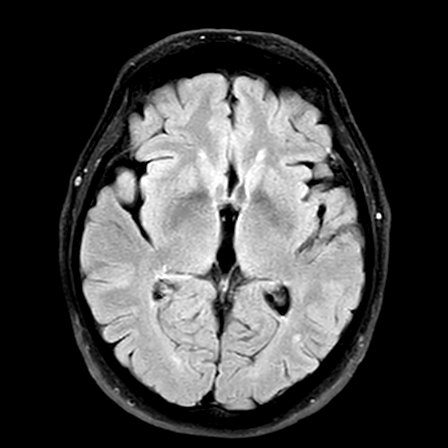
[im 27/27]
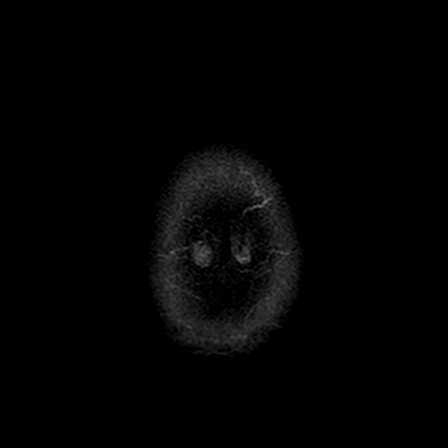

[Series 602: swip · axial · 10.0mm · 0.36mm/px · z∈[-67,+71]mm · 8 of 140 slices shown]
[im 1/140]
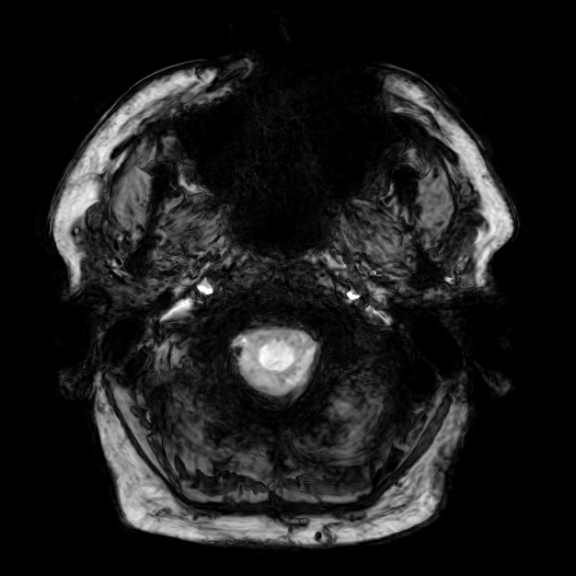
[im 22/140]
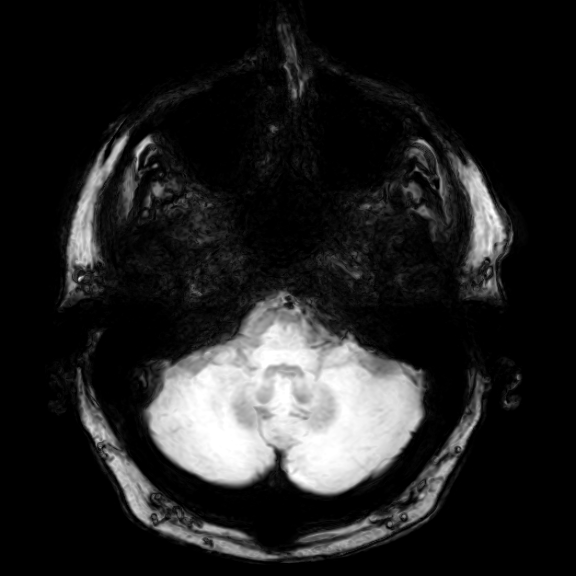
[im 43/140]
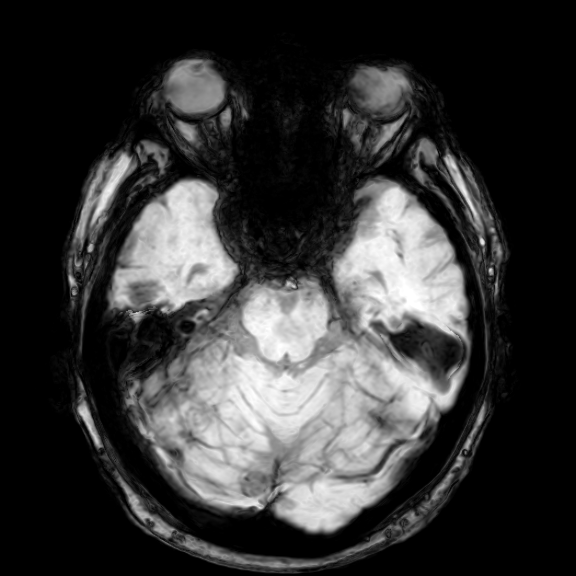
[im 65/140]
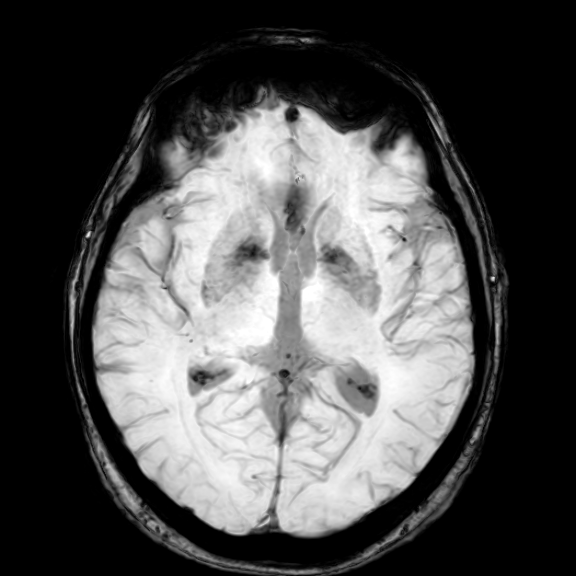
[im 75/140]
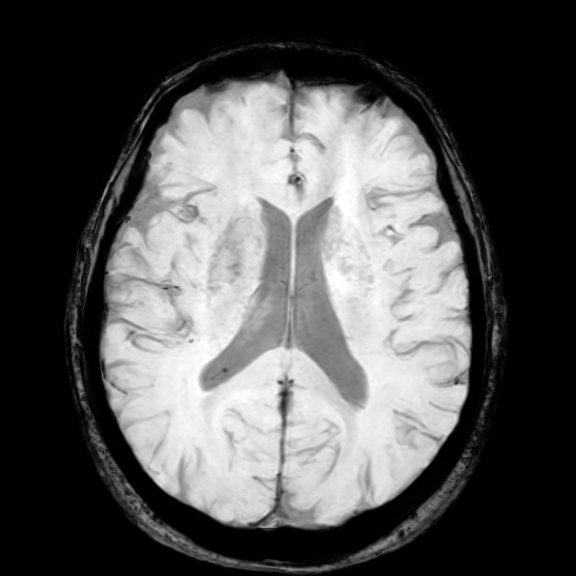
[im 97/140]
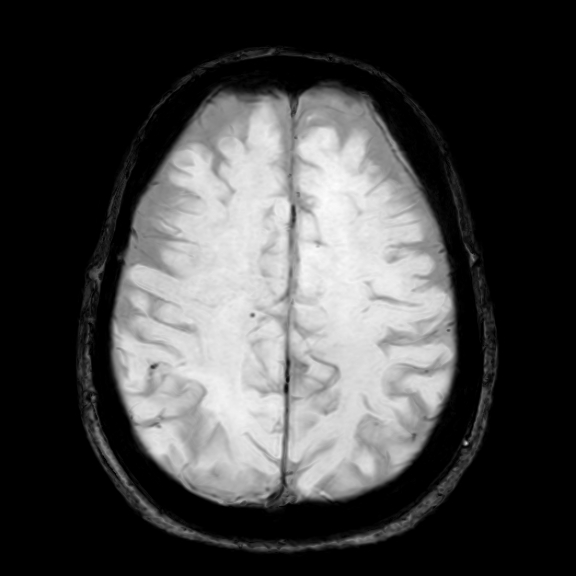
[im 118/140]
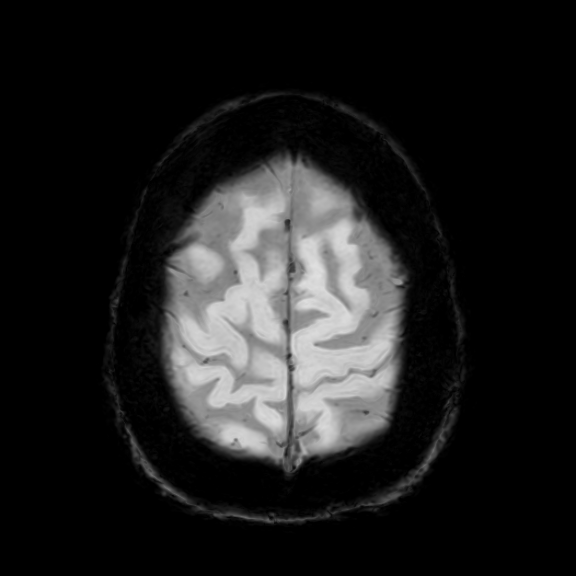
[im 140/140]
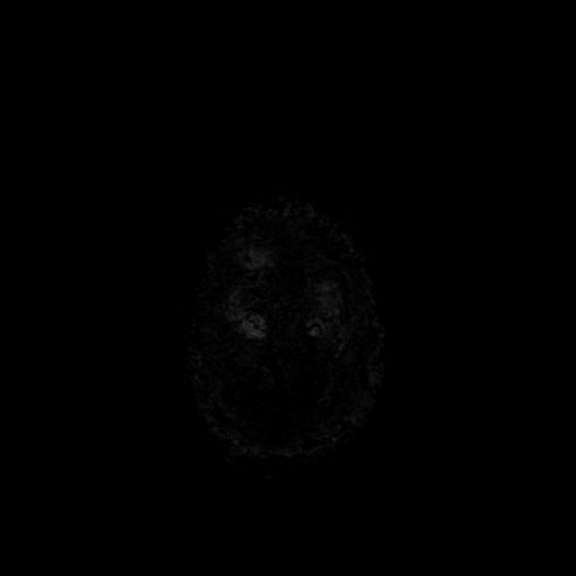

[25 of 48 positions shown; findings below may reference images not displayed]

FINDINGS: -------------------------------------------------------------------------------- 
------------------------- 
INTRACRANIAL: 
No acute ischemia. New focus of susceptibility artifact along the medial right 
superior frontal subcortical white matter and stable focus of susceptibility 
artifact along the left temporal periventricular white matter. Patency of 
intracranial vascular flow voids. Periventricular and deep white matter change, 
probably secondary to microangiopathy. This is mild and stable. No acute 
hemorrhage, midline shift, mass effect.  Cerebral volume is age appropriate.  No 
hydrocephalus.  
-------------------------------------------------------------------------------- 
----------------------- 
OTHER: 
ORBITS/SINUSES/T-BONES:  Visualized orbits show no acute abnormality or mass.  
Mastoid air cells and middle ear cavities are grossly clear.  Stable small 
retention cyst in the right maxillary sinus medially. 
MARROW SIGNAL/SOFT TISSUES: No focal suspect signal abnormality.  
-------------------------------------------------------------------------------- 
-------------------
IMPRESSION: 1.  New focus of chronic hypertensive microhemorrhage along the medial right 
superior frontal subcortical white matter with stable focus along the left 
temporal periventricular white matter. 
2.  No acute intracranial abnormality. Stable mild white matter microangiopathic 
changes.
